# Patient Record
Sex: Female | Born: 1965 | Race: Black or African American | Hispanic: No | Marital: Single | Smoking: Current every day smoker
Health system: Southern US, Community
[De-identification: ages and names within clinical notes are randomized; demographics above are authoritative.]

## PROBLEM LIST (undated history)

## (undated) DIAGNOSIS — M109 Gout, unspecified: Secondary | ICD-10-CM

## (undated) DIAGNOSIS — E7800 Pure hypercholesterolemia, unspecified: Secondary | ICD-10-CM

## (undated) DIAGNOSIS — F329 Major depressive disorder, single episode, unspecified: Secondary | ICD-10-CM

## (undated) DIAGNOSIS — F32A Depression, unspecified: Secondary | ICD-10-CM

## (undated) DIAGNOSIS — I10 Essential (primary) hypertension: Secondary | ICD-10-CM

## (undated) DIAGNOSIS — E119 Type 2 diabetes mellitus without complications: Secondary | ICD-10-CM

## (undated) HISTORY — DX: Type 2 diabetes mellitus without complications: E11.9

## (undated) HISTORY — DX: Gout, unspecified: M10.9

## (undated) HISTORY — DX: Major depressive disorder, single episode, unspecified: F32.9

## (undated) HISTORY — PX: CHOLECYSTECTOMY: SHX55

## (undated) HISTORY — PX: ABDOMINAL HYSTERECTOMY: SHX81

## (undated) HISTORY — DX: Depression, unspecified: F32.A

## (undated) HISTORY — PX: TONSILLECTOMY: SUR1361

---

## 2000-04-23 ENCOUNTER — Encounter: Admission: RE | Admit: 2000-04-23 | Discharge: 2000-04-23 | Payer: Self-pay | Admitting: Family Medicine

## 2000-04-23 ENCOUNTER — Encounter: Payer: Self-pay | Admitting: Family Medicine

## 2000-11-22 ENCOUNTER — Encounter: Payer: Self-pay | Admitting: Family Medicine

## 2000-11-22 ENCOUNTER — Ambulatory Visit (HOSPITAL_COMMUNITY): Admission: RE | Admit: 2000-11-22 | Discharge: 2000-11-22 | Payer: Self-pay | Admitting: Family Medicine

## 2003-01-05 ENCOUNTER — Inpatient Hospital Stay (HOSPITAL_COMMUNITY): Admission: EM | Admit: 2003-01-05 | Discharge: 2003-01-12 | Payer: Self-pay | Admitting: Psychiatry

## 2003-03-09 ENCOUNTER — Emergency Department (HOSPITAL_COMMUNITY): Admission: EM | Admit: 2003-03-09 | Discharge: 2003-03-09 | Payer: Self-pay | Admitting: Emergency Medicine

## 2004-01-13 ENCOUNTER — Inpatient Hospital Stay (HOSPITAL_COMMUNITY): Admission: RE | Admit: 2004-01-13 | Discharge: 2004-01-21 | Payer: Self-pay | Admitting: Psychiatry

## 2004-01-13 ENCOUNTER — Ambulatory Visit: Payer: Self-pay | Admitting: Psychiatry

## 2004-01-18 ENCOUNTER — Encounter (HOSPITAL_COMMUNITY): Payer: Self-pay | Admitting: Psychiatry

## 2004-01-20 ENCOUNTER — Encounter (HOSPITAL_COMMUNITY): Admission: RE | Admit: 2004-01-20 | Discharge: 2004-04-19 | Payer: Self-pay | Admitting: Internal Medicine

## 2005-01-15 ENCOUNTER — Ambulatory Visit: Payer: Self-pay | Admitting: Cardiology

## 2006-12-06 ENCOUNTER — Ambulatory Visit: Payer: Self-pay | Admitting: Psychiatry

## 2006-12-06 ENCOUNTER — Inpatient Hospital Stay (HOSPITAL_COMMUNITY): Admission: AD | Admit: 2006-12-06 | Discharge: 2006-12-11 | Payer: Self-pay | Admitting: *Deleted

## 2007-01-09 ENCOUNTER — Ambulatory Visit: Payer: Self-pay | Admitting: Psychiatry

## 2007-01-09 ENCOUNTER — Inpatient Hospital Stay (HOSPITAL_COMMUNITY): Admission: EM | Admit: 2007-01-09 | Discharge: 2007-01-14 | Payer: Self-pay | Admitting: Psychiatry

## 2007-04-29 ENCOUNTER — Encounter: Admission: RE | Admit: 2007-04-29 | Discharge: 2007-04-29 | Payer: Self-pay | Admitting: Family Medicine

## 2008-04-30 ENCOUNTER — Ambulatory Visit (HOSPITAL_COMMUNITY): Admission: RE | Admit: 2008-04-30 | Discharge: 2008-04-30 | Payer: Self-pay | Admitting: Family Medicine

## 2009-06-16 ENCOUNTER — Encounter (HOSPITAL_COMMUNITY): Admission: RE | Admit: 2009-06-16 | Discharge: 2009-07-16 | Payer: Self-pay | Admitting: Orthopaedic Surgery

## 2010-05-16 NOTE — Discharge Summary (Signed)
NAME:  Betty Lutz, Betty Lutz NO.:  [id number]   MEDICAL RECORD NO.:  [id number]          PATIENT TYPE:  IPS   LOCATION:  0602                          FACILITY:  [facility]   PHYSICIAN:  Jasmine Pang, M.D. DATE OF BIRTH:  12/11/1965   DATE OF ADMISSION:  12/06/2006  DATE OF DISCHARGE:  12/11/2006                               DISCHARGE SUMMARY   IDENTIFICATION:  This is a 45 year old single African American female  who was admitted on an involuntary basis on December 06, 2006.   HISTORY OF PRESENT ILLNESS:  The commitment papers indicate that the  patient is depressed.  She was exhibiting suicidal ideation.  She was  abusing substances by her report.  The patient call 911.  She stated she  had been drinking and had taken 0.5 mg Xanax.  She told 911 she was  suicidal because she and her boyfriend were having problems.  Her  alcohol level in the emergency room at [facility] was 292.  Her urine drug  screen was positive for cocaine only.  Other labs were within normal  limits done in the emergency room.  She was with [place] in the [facility] from January 13, 2004, to January 21, 2004.  She had a  similar presentation and course at that time.  She has followed  outpatient at [facility].  Her doctor is Dr.  Duayne Cal.   She has GERD, hypertension, and gout.  She is status post a  cholecystectomy.   She states she is prescribed Xanax, dose unknown.  Indocin 50 mg b.i.d.  p.r.n., Reglan 20 mg q.i.d., Remeron 30 mg at bedtime, Diovan 160 mg  p.o. daily, and Cymbalta 30 mg p.o. daily.   She denies any allergies.   PHYSICAL FINDINGS:  The patient was medically cleared in the emergency  department at [facility].  She was intoxicated.  She had no other  remarkable physical or medical findings.  Her labs done in the ED were  within normal limits.   HOSPITAL COURSE:  Upon admission, the patient was placed on the Librium  detox protocol.  She was also  continued on Wellbutrin 150 mg daily,  allopurinol 100 mg q.a.m., Diovan 80 mg q.a.m., Cymbalta 30 mg daily,  and indomethacin 50 mg p.o. b.i.d.  She was placed on Sudafed 30 mg p.o.  b.i.d.  On December 08, 2006, Cymbalta was discontinued; she was placed  on Remeron 30 mg p.o. q.h.s.  The patient tolerated these medications  well with no significant side effects.  She was friendly and cooperative  in individual sessions with me.  She was able to participate  appropriately in unit therapeutic groups and activities.  She discussed  her stressors prior to admission.  She states she is on disability for  depression and gout.  She wanted her boyfriend to come in for a family  session, and this was set up by our case Production designer, theatre/television/film.  On the day of  discharge, her mood improved.  Her sleep was good on the Remeron.  Appetite was good.  She was less  depressed and less anxious.  There was  no suicidal ideation.  She was looking forward to her family session  with her sister and boyfriend; they were very supportive.  She discussed  numerous stressors.  She wants to work on these issues in outpatient  therapy.  Boyfriend and sister were very supportive.  They discussed  their relationship and issues of trust.  She stated she felt good going  back to her boyfriend and plans on going to his place to live, at least  temporarily.  She could not think of any stressors at that time that she  needed to worry about.  Her mood was euthymic.  Affect, wide range.  There was no suicidal or homicidal ideation.  No thoughts of self-  injurious behavior.  No auditory or visual hallucinations.  No paranoia  or delusions.  Thoughts were logical and goal-directed.  Thought  content, no predominant theme.  It was felt the patient was stable to be  discharged.   DISCHARGE DIAGNOSES:  AXIS I:  Alcohol dependence.  Polysubstance abuse.  Depressive disorder, not otherwise specified.  AXIS II:  None.  AXIS III:  The patient has  gastroesophageal reflux disease and  hypertension.  AXIS IV:  Moderate (relationship issues, burden of chemical dependence  issues, and burden of psychiatric illness).  AXIS V:  Global assessment of functioning upon discharge was 50.  Global  assessment of functioning upon admission was 45.  Global assessment of  functioning highest past year was 60-65.   DISCHARGE PLANS:  There were no specific activity level or dietary  restrictions.   POST-HOSPITAL CARE PLANS:  The patient will go to [facility] on December 19, 2006, at 10 a.m.   DISCHARGE MEDICATIONS:  1. Wellbutrin XL 150 mg one tablet daily.  2. Multivitamin one tablet daily.  3. Allopurinol 100 mg daily.  4. Avapro/Diovan one tablet daily.  5. Indocin 50 mg one tablet twice a day with food.  6. Tylenol 325 mg two caps every 4 hours as needed for pain.      Jasmine Pang, M.D.  Electronically Signed     BHS/MEDQ  D:  01/08/2007  T:  01/09/2007  Job:  [id number]

## 2010-05-16 NOTE — H&P (Signed)
NAME:  Betty Lutz, BLASS NO.:  [id number]   MEDICAL RECORD NO.:  [id number]          PATIENT TYPE:  IPS   LOCATION:  0502                          FACILITY:  [facility]   PHYSICIAN:  Geoffery Lyons, M.D.      DATE OF BIRTH:  [birth year]   DATE OF ADMISSION:  01/09/2007  DATE OF DISCHARGE:                       PSYCHIATRIC ADMISSION ASSESSMENT   A 45 year old female voluntarily admitted on January 09, 2007.   HISTORY OF PRESENT ILLNESS:  The patient present with a history of  alcohol use, has been drinking 2-3 40 ounces beers daily.  Relapsed  approximately  2 days ago, was sober for about 3 weeks.  The patient  states that she has been drinking since the age of 4.  She states that  she has tried to stop prior.  She was recently discharged from out  facility in December for similar symptoms. She states that the reason  she relapsed was that she found out her boyfriend was cheating on her.  She intends to go back with this boyfriend.  She does report some  suicidal thoughts but denies any specific plan.  Also has been using  cocaine.   PAST PSYCHIATRIC HISTORY:  Second admission to [facility],  was here in December 2008 for alcohol use and depressive symptoms.  Was  scheduled to go to AA meetings but states she had problems with  transportation.  She is a client of [facility], seeing Dr. Rudi Heap.  Reports  a history of overdose in the past and alcohol.   SOCIAL HISTORY:  She is a 45 year old female, has a 71 year old son and  lives in [place].  Son is a working and she has no concerns about  him.  She is unemployed and is on SSI for her depression and gout.  She  does have a supportive sister.   FAMILY HISTORY:  None.   ALCOHOL DRUG HISTORY:  As above.  Denies any seizures or blackouts.   MEDICAL HISTORY:  Primary care [place] Fails:  None listed.  Medical problems  are gout, hypertension.   MEDICATIONS:  Has been on Wellbutrin 300 mg daily, Remeron 30 at  bedtime, Cymbalta 30 daily, allopurinol 100 mg daily, Diovan 160 mg p.o.  daily.   DRUG ALLERGIES:  No known allergies.   PHYSICAL EXAM:  This is a female that was assessed at [facility]  overweight, unkempt, but in no distress.  No tremors.  No withdrawal  symptoms noted. Her temperature is 97.5, 92 heart rate, 22 respirations,  blood pressure is 134/85, 5 feet 6 inches tall, 248 pounds. Her urine  drug screen is positive for cocaine.  Urinalysis is negative.  Hemoglobin of 11.5, hematocrit 33.6, RDW is 15.9.   MENTAL STATUS EXAM:  She is fully alert, cooperative, good eye contact,  somewhat unkempt.  Her speech is clear, normal pace and tone.  The  patient's mood is depressed.  The patient's affect is flat, although she  does seem to have a sense of humor.  Her thought process are coherent  and goal directed.  Denies any suicidal thoughts or homicidal ideation,  cognitive function intact.  Her memory is good.  Her judgment and  insight is fair.  Poor impulse control.  She appears sincere.   AXIS I:  Depressive disorder NOS, polysubstance abuse.  AXIS II:  Deferred.  AXIS III:  Hypertension, gout AXIS IV:  Problems with primary support  group, other psychosocial problems.  Medical problems.  AXIS V:  Current is 40.   PLAN:  Contract for safety.  Stabilize mood and thinking.  We will DC  her Xanax, will have Librium available for withdrawal symptoms.  We will  have a case manager look at any rehab programs.  The patient is  considering inpatient rehab program. We will work on relapse prevention  and medication compliance.  Her tentative length of stay is 4-5 days.      Landry Corporal, N.P.      Geoffery Lyons, M.D.  Electronically Signed    JO/MEDQ  D:  01/10/2007  T:  01/10/2007  Job:  [id number]

## 2010-05-16 NOTE — H&P (Signed)
NAME:  Betty Lutz, Betty Lutz NO.:  [id number]   MEDICAL RECORD NO.:  [id number]          PATIENT TYPE:  IPS   LOCATION:  0602                          FACILITY:  [facility]   PHYSICIAN:  Jasmine Pang, M.D. DATE OF BIRTH:  30-Jan-1965   DATE OF ADMISSION:  12/06/2006  DATE OF DISCHARGE:                       PSYCHIATRIC ADMISSION ASSESSMENT   This is an involuntary admission to the services of Dr. Milford Cage.   IDENTIFYING INFORMATION:  This is a 45 year old single African American  female.  The commitment papers indicate that she is depressed, she was  exhibiting suicidal ideation, she has been abusing substances.  The  patient called 911.  She stated she had been drinking and had taken five  0.5 mg Xanax.  She told 911 she was suicidal because she and her  boyfriend were having problems.  Her alcohol level, in the emergency  room at [facility], was 292.  Her urine drug screen was positive for  cocaine only, and her other labs were within normal limits.   PAST PSYCHIATRIC HISTORY:  She was here with [place] in the behavioral health  center January 12 to January 21, 2004.  She had a similar presentation  and course at that time.  She is followed outpatient at [facility].   SOCIAL HISTORY:  She went to the 11th grade.  She was never married.  She does have one 8 year old son.  She gets SSI for gout and  depression.   FAMILY HISTORY:  She denies.   ALCOHOL AND DRUG HISTORY:  She varies on her alcohol abuse.  It depends  on how depressed she is, but she can drink up to a fifth of liquor a  day.   PRIMARY CARE PHYSICIAN:  Unknown.  She is followed at [facility] by Dr. Duayne Cal.   MEDICAL PROBLEMS:  GERD, hypertension, gout.  She is status post a  cholecystectomy.   MEDICATIONS:  She states that she is prescribed Xanax 0.5 mg __________  is unknown, Indocin 50 mg p.o. b.i.d. p.r.n., Reglan 20 mg q.i.d.,  Remeron 30 mg at h.s.,  Diovan 160 mg p.o. every day, Cymbalta 30 mg p.o.  every day.   DRUG ALLERGIES:  NO KNOWN DRUG ALLERGIES.   POSITIVE PHYSICAL FINDINGS:  She was medically cleared in the emergency  department at [facility].  She was intoxicated.  She had no other  remarkable physical findings today.  VITAL SIGNS:  Her vital signs show she is 66.5, weight is 239,  temperature 97.1, blood pressure is 170/91 and standing it is 147/93,  pulse is 94, respirations are 20.  She has a slight hand tremor and she  also has a cold.  MENTAL STATUS EXAM:  She is alert and oriented x3.  She is casually, but  appropriately groomed and dressed, adequately nourished.  Her speech is  a normal rate, rhythm, and tone.  Her mood is appropriate to the  situation.  Her affect is somewhat depressed.  Thought processes are  clear, rational, goal oriented.  Judgment and insight are fair.  Concentration and memory are  intact.  Intelligence is average to low  average.  She denies being suicidal or homicidal.  She denies  auditory/visual hallucinations.  She does have a slight hand tremor from  her withdrawal.   DIAGNOSES:  AXIS I:  Alcohol abuse, rule out dependence.  Here for  detox.  Depressive disorder not otherwise specified.  AXIS II:  Deferred.  AXIS III:  Developing a cold, runny nose, GERD, hypertension.  AXIS IV:  Relationship issues.  AXIS V:  45.   PLAN:  To admit for alcohol detox.  She denies the need for medication  adjustment.  She just states she had possibly overdosed after fight with  boyfriend and drinking.  She has her own place and feels safe to return.  Her estimated length of stay is 2 to 3 days.      Mickie Leonarda Salon, P.A.-C.      Jasmine Pang, M.D.  Electronically Signed    MD/MEDQ  D:  12/07/2006  T:  12/09/2006  Job:  [id number]

## 2010-05-19 NOTE — Discharge Summary (Signed)
NAME:  Betty Lutz, ABDELRAHMAN NO.:  [id number]   MEDICAL RECORD NO.:  [id number]          PATIENT TYPE:  IPS   LOCATION:  0502                          FACILITY:  [facility]   PHYSICIAN:  Geoffery Lyons, M.D.      DATE OF BIRTH:  Dec 15, 1965   DATE OF ADMISSION:  01/09/2007  DATE OF DISCHARGE:  01/14/2007                               DISCHARGE SUMMARY   CHIEF COMPLAINT AND PRESENT ILLNESS:  This was the second admission to  [facility] for this 45 year old female voluntarily  admitted.  History of alcohol use.  Has been drinking 2-3 40 ounces of  beer daily.  Relapsed 2 days prior to this admission.  Was sober for  about 3 weeks.  She had been drinking since the age of 62.  She had  tried to stop, recently discharged from our facility in December for the  same reason.  Claims she relapsed after she found out that her boyfriend  was cheating on her.  She intends to go back with this boyfriend.  Has  been also using cocaine.   PAST PSYCHIATRIC HISTORY:  Second time at Behavior Health.  Admitted  December 2008 for alcohol abuse and depressive symptoms. Client of  [facility],  been seen by Dr. Rudi Heap.   ALCOHOL AND DRUG HISTORY:  As already stated, she just relapsed on  alcohol, also using cocaine.   MEDICAL HISTORY:  Gout and hypertension.   MEDICATION:  1. Wellbutrin 300 mg per day.  2. Remeron 30 mg at bedtime.  3. Cymbalta 30 mg per day.  4. Allopurinol 100 mg per day.  5. Diovan 160 mg per day.   Physical exam was performed and failed to show any acute findings.   LABORATORY WORKUP:  White blood cells 7.3, hemoglobin 11.5, mean  corpuscular volume 78.1.  Drug screening positive for cocaine.  Glucose  126, BUN 6,  6, creatinine 0.9, potassium 3.8 sodium 137.   MENTAL STATUS EXAM:  An alert, cooperative female, good eye contact,  somewhat unkempt.  Speech was clear, normal in rate, tempo and  production.  Mood was depressed.  Affect was depressed.  Thought  processes are logical, coherent and relevant.  Endorsed feeling  overwhelmed, upset with the situation with the boyfriend, upset with  herself for having relapsed, but denied any active suicidal or homicidal  ideas, no evidence of delusions.  Cognition:  No hallucinations.  Cognition well-preserved.   ADMISSION DIAGNOSES:  AXIS I:  1.  Depressive disorder, not otherwise  specified.  2.  Alcohol abuse, rule out dependence.  3.  Cocaine abuse,  rule out dependence.  AXIS II:  No diagnosis.  AXIS III:  1.  Hypertension.  2.  Gout.  AXIS IV:  Moderate.  AXIS V:  On admission 35,  highest GAF in the last year 60.   COURSE IN THE HOSPITAL:  She was admitted, started in individual and  group psychotherapy.  We did a Librium detox.  She was placed on  allopurinol and was given Indocin.  As already stated, a 45 year old  female recently discharged  from the unit, initially admitted due to  depression as well as substance abuse status post suicide gesture,  overdosing on Xanax.  She was detoxed, then discharged stable.  Stayed  sober for a couple of weeks and relapsed after she found out that the  boyfriend was cheating on her.  The boyfriend also drinks.  Although she  knows this is not a healthy relationship, she still wants to maintain  it.  Has been drinking since 20 on and off.  Lives by herself, on  disability due to depression and gout.  Has a 91 year old son who lives  in [place].  By January 12 endorsed that she was starting to feel  better.  Boyfriend came to see her and he was willing to work on the  relationship.  Endorsed that she would not allow issues with him to send  back using.  Endorsed that she was pretty committed to make this work  for herself, was wanting to go into a residential treatment program but  on January 13 endorsed that she was better, committed to abstinence, and  was wanting to go home and follow up on an outpatient basis.  Endorsed  that she was clear  that if this was not to work with her boyfriend, she  was going to leave him and no matter what happened, she was not going to  relapse.   DISCHARGE DIAGNOSES:  AXIS I:  1.  Alcohol dependence.  2.  Cocaine  abuse.  3.  Major depressive disorder.  AXIS II:  No diagnosis.  AXIS III:  1.  Hypertension.  2.  Gout.  AXIS IV:  Moderate.  AXIS V:  On discharge 50-55.   Discharged on:  1. Allopurinol 100 mg per day.  2. Indocin 25 mg twice a day with food.  3. Remeron 30 mg per day.  4. Diovan 160 mg in the morning.   Follow up at [facility], Dr. Mammie Russian.      Geoffery Lyons, M.D.  Electronically Signed     IL/MEDQ  D:  02/02/2007  T:  02/03/2007  Job:  [id number]

## 2010-05-19 NOTE — Discharge Summary (Signed)
NAME:  Betty Lutz, Betty Lutz                         ACCOUNT NO.:  [id number]   MEDICAL RECORD NO.:  [id number]                   PATIENT TYPE:  IPS   LOCATION:  0303                                 FACILITY:  BH   PHYSICIAN:  Jeanice Lim, M.D.              DATE OF BIRTH:  Oct 09, 1965   DATE OF ADMISSION:  01/05/2003  DATE OF DISCHARGE:  01/12/2003                                 DISCHARGE SUMMARY   IDENTIFYING DATA:  This is a 45 year old African-American female, single,  voluntarily admitted, requesting help getting off alcohol, requiring detox.  Reporting thoughts of hopelessness, wanting to drink herself to death,  unable to stop drinking.   MEDICATIONS:  Wellbutrin, Diovan, Remeron.   ALLERGIES:  No known drug allergies.   PHYSICAL EXAMINATION:  Within normal limits.  Neurologically nonfocal.   LABORATORY DATA:  Routine admission labs within normal limits.   MENTAL STATUS EXAM:  Fully alert, blunted affect, limping left foot  secondary to gout.  Speech within normal limits.  Mood depressed and mildly  anxious.  Some tremor.  Thought processes goal directed.  Positive suicidal  ideation without plan.  Cognitively intact.  Judgment and insight fair.   ADMISSION DIAGNOSES:   AXIS I:  1. Major depressive disorder, recurrent, severe.  2. Alcohol dependence.  3. Cocaine abuse history.   AXIS II:  Deferred.   AXIS III:  1. Microcytic anemia.  2. Acute gouty arthritis.   AXIS IV:  Severe (stressors related to social environment).   AXIS V:  30/60.   HOSPITAL COURSE:  The patient was admitted and ordered routine p.r.n.  medications and underwent further monitoring.  Was encouraged to participate  in individual, group and milieu therapy.  The patient described having some  mood swings as well as experienced withdrawal symptoms.  Seen by internal  medicine regarding multiple medical problems and all medical recommendations  are followed as indicated.  The patient was  adjusted on medical medications  as well as psychotropics and safely detoxed without complications.  The  patient reported improved sleep, mood stability and motivation to get into a  substance abuse treatment program.  Motivated to be abstinent, showing  increased insight and healthier coping skills.   DISCHARGE MEDICATIONS:  1. Prednisone 20 mg b.i.d.  2. Risperdal 1 mg, 1/2 q.a.m. and 1 q.h.s.  3. Trazodone 100 mg q.h.s.  4. Ambien 10 mg, 1/2-1 q.h.s.  5. Iron 325 mg b.i.d.  6. Protonix 40 mg q.a.m.  7. Wellbutrin XL 150 mg q.a.m.  8. Colchicine 0.6 mg q.a.m.  9. Remeron 30 mg q.h.s.   FOLLOW UP:  The patient was to follow up at [facility] and [facility].   DISCHARGE DIAGNOSES:   AXIS I:  1. Major depressive disorder, recurrent, severe.  2. Alcohol dependence.  3. Cocaine abuse history.   AXIS II:  Deferred.   AXIS III:  1. Microcytic  anemia.  2. Acute gouty arthritis.   AXIS IV:  Severe (stressors related to social environment).   AXIS V:  Global Assessment of Functioning on discharge 55.                                               Jeanice Lim, M.D.    JEM/MEDQ  D:  02/07/2003  T:  02/07/2003  Job:  [id number]
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
## 2010-10-26 ENCOUNTER — Encounter: Payer: Self-pay | Admitting: *Deleted

## 2010-10-26 ENCOUNTER — Emergency Department (HOSPITAL_COMMUNITY)
Admission: EM | Admit: 2010-10-26 | Discharge: 2010-10-26 | Disposition: A | Discharge status: Home / Self Care | Payer: Medicaid Other | Attending: Emergency Medicine | Admitting: Emergency Medicine

## 2010-10-26 DIAGNOSIS — M79609 Pain in unspecified limb: Secondary | ICD-10-CM | POA: Insufficient documentation

## 2010-10-26 DIAGNOSIS — T148XXA Other injury of unspecified body region, initial encounter: Secondary | ICD-10-CM

## 2010-10-26 DIAGNOSIS — X58XXXA Exposure to other specified factors, initial encounter: Secondary | ICD-10-CM | POA: Insufficient documentation

## 2010-10-26 DIAGNOSIS — M542 Cervicalgia: Secondary | ICD-10-CM | POA: Insufficient documentation

## 2010-10-26 DIAGNOSIS — I10 Essential (primary) hypertension: Secondary | ICD-10-CM | POA: Insufficient documentation

## 2010-10-26 DIAGNOSIS — M25519 Pain in unspecified shoulder: Secondary | ICD-10-CM | POA: Insufficient documentation

## 2010-10-26 DIAGNOSIS — F17200 Nicotine dependence, unspecified, uncomplicated: Secondary | ICD-10-CM | POA: Insufficient documentation

## 2010-10-26 DIAGNOSIS — Z79899 Other long term (current) drug therapy: Secondary | ICD-10-CM | POA: Insufficient documentation

## 2010-10-26 HISTORY — DX: Pure hypercholesterolemia, unspecified: E78.00

## 2010-10-26 HISTORY — DX: Essential (primary) hypertension: I10

## 2010-10-26 MED ORDER — NAPROXEN 500 MG PO TABS: 500.0000 mg | ORAL_TABLET | Freq: Two times a day (BID) | ORAL | Status: AC

## 2010-10-26 MED ORDER — KETOROLAC TROMETHAMINE 60 MG/2ML IM SOLN
60.0000 mg | Freq: Once | INTRAMUSCULAR | Status: AC
  Administered 2010-10-26: 60 mg via INTRAMUSCULAR
  Filled 2010-10-26: qty 2
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
## 2012-04-23 ENCOUNTER — Other Ambulatory Visit: Payer: Self-pay

## 2012-04-30 ENCOUNTER — Other Ambulatory Visit (INDEPENDENT_AMBULATORY_CARE_PROVIDER_SITE_OTHER): Payer: Medicaid Other

## 2012-04-30 DIAGNOSIS — Z79899 Other long term (current) drug therapy: Secondary | ICD-10-CM

## 2012-04-30 LAB — LIPID PANEL
LDL Cholesterol: 98 mg/dL (ref 0–99)
Triglycerides: 201 mg/dL — ABNORMAL HIGH (ref <150)
VLDL: 40 mg/dL (ref 0–40)

## 2012-04-30 LAB — HEPATIC FUNCTION PANEL
ALT: 21 U/L (ref 0–35)
Bilirubin, Direct: 0.1 mg/dL (ref 0.0–0.3)
Indirect Bilirubin: 0.4 mg/dL (ref 0.0–0.9)

## 2012-04-30 LAB — HEMOGLOBIN A1C: Hgb A1c MFr Bld: 5.7 % — ABNORMAL HIGH (ref <5.7)

## 2012-04-30 NOTE — Progress Notes (Signed)
Patient has been seeing donna steadman but is going to make an appointment to establish someone new. These labs were ordered by [facility] and they need to be faxed to [phone].

## 2012-05-07 ENCOUNTER — Other Ambulatory Visit: Payer: Self-pay | Admitting: *Deleted

## 2012-05-28 ENCOUNTER — Ambulatory Visit (INDEPENDENT_AMBULATORY_CARE_PROVIDER_SITE_OTHER): Payer: Medicaid Other | Admitting: Pharmacist

## 2012-05-28 VITALS — BP 138/90 | HR 78 | Ht 67.5 in | Wt 254.0 lb

## 2012-05-28 DIAGNOSIS — E669 Obesity, unspecified: Secondary | ICD-10-CM | POA: Insufficient documentation

## 2012-05-28 DIAGNOSIS — F32A Depression, unspecified: Secondary | ICD-10-CM

## 2012-05-28 DIAGNOSIS — F329 Major depressive disorder, single episode, unspecified: Secondary | ICD-10-CM | POA: Insufficient documentation

## 2012-05-28 DIAGNOSIS — I10 Essential (primary) hypertension: Secondary | ICD-10-CM

## 2012-05-28 DIAGNOSIS — E785 Hyperlipidemia, unspecified: Secondary | ICD-10-CM

## 2012-05-28 DIAGNOSIS — E119 Type 2 diabetes mellitus without complications: Secondary | ICD-10-CM

## 2012-05-28 DIAGNOSIS — F339 Major depressive disorder, recurrent, unspecified: Secondary | ICD-10-CM | POA: Insufficient documentation

## 2012-05-28 DIAGNOSIS — M109 Gout, unspecified: Secondary | ICD-10-CM | POA: Insufficient documentation

## 2012-05-28 MED ORDER — ATORVASTATIN CALCIUM 40 MG PO TABS: 40.0000 mg | ORAL_TABLET | Freq: Every day | ORAL | Status: DC

## 2012-05-28 MED ORDER — METFORMIN HCL ER 500 MG PO TB24: 500.0000 mg | ORAL_TABLET | Freq: Every day | ORAL | Status: DC

## 2012-05-28 NOTE — Progress Notes (Signed)
Diabetes Follow-Up Visit Chief Complaint:  No chief complaint on file.    Filed Vitals:   05/28/12 1024  BP: 138/90  Pulse: 78   Filed Weights   05/28/12 1024  Weight: 254 lb (115.214 kg)     HPI: patient diagnosis with type 2 diabetes several years ago.  Previously took metformin XR 500mg  daily with good results but patient stopped a few months ago because she thought 500mg  was a large dose.   Also she has not taken atorvastatin 80mg  for 1 year.     Current Diabetes Medications:  none  Exam Edema:  negative  Polyuria:  negative  Polydipsia:  negative Polyphagia:  negative  BMI:  Body mass index is 39.17 kg/(m^2).   Weight changes:  stable General Appearance:  obese Mood/Affect:  normal   Low fat/carbohydrate diet?  No Nicotine Abuse?  Yes Medication Compliance?  No Exercise?  No Alcohol Abuse?  No  Home BG Monitoring:  Checking 1 times a day. High: 179  Low:  110    Lab Results  Component Value Date   HGBA1C 5.7* 04/30/2012    No results found for this basenameConcepcion Lutz    Lab Results  Component Value Date   CHOL 187 04/30/2012   HDL 49 04/30/2012   LDLCALC 98 04/30/2012   TRIG 201* 04/30/2012   CHOLHDL 3.8 04/30/2012      Assessment: 1.  Diabetes.  Controlled but should restart metformin 2.  Blood Pressure.  DBP slightly elevated today 3.  Lipids.  Elevated Tg - should be on a statin   Recommendations: 1.  Medication recommendations at this time are as follows:  Restart metformin 500mg  XR 1 tablet daily  Restart atorvastatin at 40mg  1 tablet daily 2.  Reviewed HBG goals:  Fasting 80-130 and 1-2 hour post prandial <180.  Patient is instructed to check BG 1 times per day.    3.  BP goal < 140/80. 4.  LDL goal of < 100, HDL > 40 and TG < 150. 5.  Eye Exam yearly and Dental Exam every 6 months. 6.  Dietary recommendations:  Reviewed CHO amounts in various foods and serving recommendations.  Patient instructed to increase fresh fruits and  vegetables, limit high CHO foods, sweets and fatty meats.  7.  Physical Activity recommendations:  Recommended start exercise - 10 minutes and work up to 30 minutes daily as able 8.  Return to clinic in 4-6 wks   Time spent counseling patient:  40 minutes  Referring provider:  Modesto Charon

## 2012-05-28 NOTE — Patient Instructions (Addendum)
1800 Calorie Diet for Diabetes Meal Planning The 1800 calorie diet is designed for eating up to 1800 calories each day. Following this diet and making healthy meal choices can help improve overall health. This diet controls blood sugar (glucose) levels and can also help lower blood pressure and cholesterol. SERVING SIZES Measuring foods and serving sizes helps to make sure you are getting the right amount of food. The list below tells how big or small some common serving sizes are:  1 oz.........4 stacked dice.  3 oz.........Deck of cards.  1 tsp........Tip of little finger.  1 tbs........Thumb.  2 tbs........Golf ball.   cup.......Half of a fist.  1 cup........A fist. GUIDELINES FOR CHOOSING FOODS The goal of this diet is to eat a variety of foods and limit calories to 1800 each day. This can be done by choosing foods that are low in calories and fat. The diet also suggests eating small amounts of food frequently. Doing this helps control your blood glucose levels so they do not get too high or too low. Each meal or snack may include a protein food source to help you feel more satisfied and to stabilize your blood glucose. Try to eat about the same amount of food around the same time each day. This includes weekend days, travel days, and days off work. Space your meals about 4 to 5 hours apart and add a snack between them if you wish.  For example, a daily food plan could include breakfast, a morning snack, lunch, dinner, and an evening snack. Healthy meals and snacks include whole grains, vegetables, fruits, lean meats, poultry, fish, and dairy products. As you plan your meals, select a variety of foods. Choose from the bread and starch, vegetable, fruit, dairy, and meat/protein groups. Examples of foods from each group and their suggested serving sizes are listed below. Use measuring cups and spoons to become familiar with what a healthy portion looks like. Bread and Starch Each serving  equals 15 grams of carbohydrates.  1 slice bread.   bagel.   cup cold cereal (unsweetened).   cup hot cereal or mashed potatoes.  1 small potato (size of a computer mouse).   cup cooked pasta or rice.   English muffin.  1 cup broth-based soup.  3 cups of popcorn.  4 to 6 whole-wheat crackers.   cup cooked beans, peas, or corn. Vegetable Each serving equals 5 grams of carbohydrates.   cup cooked vegetables.  1 cup raw vegetables.   cup tomato or vegetable juice. Fruit Each serving equals 15 grams of carbohydrates.  1 small apple or orange.  1 cup watermelon or strawberries.   cup applesauce (no sugar added).  2 tbs raisins.   banana.   cup canned fruit, packed in water, its own juice, or sweetened with a sugar substitute.   cup unsweetened fruit juice. Dairy Each serving equals 12 to 15 grams of carbohydrates.  1 cup fat-free milk.  6 oz artificially sweetened yogurt or plain yogurt.  1 cup low-fat buttermilk.  1 cup soy milk.  1 cup almond milk. Meat/Protein  1 large egg.  2 to 3 oz meat, poultry, or fish.   cup low-fat cottage cheese.  1 tbs peanut butter.  1 oz low-fat cheese.   cup tuna in water.   cup tofu. Fat  1 tsp oil.  1 tsp trans-fat-free margarine.  1 tsp butter.  1 tsp mayonnaise.  2 tbs avocado.  1 tbs salad dressing.  1 tbs cream cheese.    2 tbs sour cream. SAMPLE 1800 CALORIE DIET PLAN Breakfast   cup unsweetened cereal (1 carb serving).  1 cup fat-free milk (1 carb serving).  1 slice whole-wheat toast (1 carb serving).   small banana (1 carb serving).  1 scrambled egg.  1 tsp trans-fat-free margarine. Lunch  Tuna sandwich.  2 slices whole-wheat bread (2 carb servings).   cup canned tuna in water, drained.  1 tbs reduced fat mayonnaise.  1 stalk celery, chopped.  2 slices tomato.  1 lettuce leaf.  1 cup carrot sticks.  24 to 30 seedless grapes (2 carb  servings).  6 oz light yogurt (1 carb serving). Afternoon Snack  3 graham cracker squares (1 carb serving).  Fat-free milk, 1 cup (1 carb serving).  1 tbs peanut butter. Dinner  3 oz salmon, broiled with 1 tsp oil.  1 cup mashed potatoes (2 carb servings) with 1 tsp trans-fat-free margarine.  1 cup fresh or frozen green beans.  1 cup steamed asparagus.  1 cup fat-free milk (1 carb serving). Evening Snack  3 cups air-popped popcorn (1 carb serving).  2 tbs parmesan cheese sprinkled on top. MEAL PLAN Use this worksheet to help you make a daily meal plan based on the 1800 calorie diet suggestions. If you are using this plan to help you control your blood glucose, you may interchange carbohydrate-containing foods (dairy, starches, and fruits). Select a variety of fresh foods of varying colors and flavors. The total amount of carbohydrate in your meals or snacks is more important than making sure you include all of the food groups every time you eat. Choose from the following foods to build your day's meals:  8 Starches.  4 Vegetables.  3 Fruits.  2 Dairy.  6 to 7 oz Meat/Protein.  Up to 4 Fats. Your dietician can use this worksheet to help you decide how many servings and which types of foods are right for you. BREAKFAST Food Group and Servings / Food Choice Starch ________________________________________________________ Dairy _________________________________________________________ Fruit _________________________________________________________ Meat/Protein __________________________________________________ Fat ___________________________________________________________ LUNCH Food Group and Servings / Food Choice Starch ________________________________________________________ Meat/Protein __________________________________________________ Vegetable _____________________________________________________ Fruit  _________________________________________________________ Dairy _________________________________________________________ Fat ___________________________________________________________ AFTERNOON SNACK Food Group and Servings / Food Choice Starch ________________________________________________________ Meat/Protein __________________________________________________ Fruit __________________________________________________________ Dairy _________________________________________________________ DINNER Food Group and Servings / Food Choice Starch _________________________________________________________ Meat/Protein ___________________________________________________ Dairy __________________________________________________________ Vegetable ______________________________________________________ Fruit ___________________________________________________________ Fat ____________________________________________________________ EVENING SNACK Food Group and Servings / Food Choice Fruit __________________________________________________________ Meat/Protein ___________________________________________________ Dairy __________________________________________________________ Starch _________________________________________________________ DAILY TOTALS Starch ____________________________ Vegetable _________________________ Fruit _____________________________ Dairy _____________________________ Meat/Protein______________________ Fat _______________________________ Document Released: 07/10/2004 Document Revised: 03/12/2011 Document Reviewed: 11/03/2010 [employer] Patient Information 2014 [employer].  

## 2012-06-25 ENCOUNTER — Ambulatory Visit: Payer: Self-pay | Admitting: Physician Assistant

## 2012-07-03 ENCOUNTER — Ambulatory Visit: Payer: Self-pay | Admitting: Physician Assistant

## 2012-07-16 ENCOUNTER — Encounter: Payer: Self-pay | Admitting: Physician Assistant

## 2012-07-16 ENCOUNTER — Ambulatory Visit (INDEPENDENT_AMBULATORY_CARE_PROVIDER_SITE_OTHER): Payer: Medicaid Other | Admitting: Physician Assistant

## 2012-07-16 VITALS — BP 155/90 | HR 110 | Temp 98.7°F | Ht 67.0 in | Wt 255.0 lb

## 2012-07-16 DIAGNOSIS — J069 Acute upper respiratory infection, unspecified: Secondary | ICD-10-CM

## 2012-07-16 MED ORDER — AMOXICILLIN-POT CLAVULANATE 875-125 MG PO TABS: 1.0000 | ORAL_TABLET | Freq: Two times a day (BID) | ORAL | Status: DC

## 2012-07-16 NOTE — Progress Notes (Signed)
Subjective:     Patient ID: Betty Lutz, female   DOB: March 27, 1965, 47 y.o.   MRN: [id number]  HPI Pt with prod cough, head congestion and R ear pain for 2 weeks She had used OTC meds with minimal relief of sx Pt with hx of NIDDM and has noticed sl increase in BS readings over the last several days   Review of Systems  All other systems reviewed and are negative.       Objective:   Physical Exam  Nursing note and vitals reviewed. Ears- L canal/TM nl; R canal nl TM with fluid and erythem Oral- no lesions, no increase in tonsil size, thick PND No cerv nodes Heart- RRR w/o M Lungs- CTA bilat     Assessment:     1. Acute URI        Plan:     Augmentin rx Fluids  Rest F/U with regular provider for regular diabetic appt

## 2012-07-16 NOTE — Patient Instructions (Addendum)

## 2012-07-18 ENCOUNTER — Other Ambulatory Visit: Payer: Self-pay | Admitting: Family Medicine

## 2012-07-21 ENCOUNTER — Encounter: Payer: Self-pay | Admitting: *Deleted

## 2012-07-21 NOTE — Telephone Encounter (Signed)
NOT ON EPIC MED LIST. BUT LAST REFILL 4/13. LAST OV 07/16/12.

## 2012-07-21 NOTE — Telephone Encounter (Signed)
This encounter was created in error - please disregard.

## 2012-07-22 ENCOUNTER — Other Ambulatory Visit: Payer: Self-pay | Admitting: *Deleted

## 2012-07-22 MED ORDER — FLUTICASONE PROPIONATE 50 MCG/ACT NA SUSP: 2.0000 | Freq: Every day | NASAL | Status: DC

## 2012-07-22 NOTE — Telephone Encounter (Signed)
Patient last seen in office on 7-16. Rx last filled on 05-01-11. Please advise.

## 2012-07-29 ENCOUNTER — Other Ambulatory Visit: Payer: Self-pay | Admitting: Family Medicine

## 2012-07-29 MED ORDER — FLUTICASONE PROPIONATE 50 MCG/ACT NA SUSP: 2.0000 | Freq: Every day | NASAL | Status: DC

## 2012-07-29 NOTE — Telephone Encounter (Signed)
Fluticasone ns sent in

## 2012-08-25 ENCOUNTER — Ambulatory Visit: Payer: Medicaid Other | Admitting: Nurse Practitioner

## 2012-08-28 ENCOUNTER — Ambulatory Visit (INDEPENDENT_AMBULATORY_CARE_PROVIDER_SITE_OTHER): Payer: Medicaid Other

## 2012-08-28 ENCOUNTER — Ambulatory Visit (INDEPENDENT_AMBULATORY_CARE_PROVIDER_SITE_OTHER): Payer: Medicaid Other | Admitting: Family Medicine

## 2012-08-28 ENCOUNTER — Encounter: Payer: Self-pay | Admitting: Family Medicine

## 2012-08-28 VITALS — BP 119/75 | HR 94 | Temp 98.5°F | Ht 67.5 in | Wt 255.8 lb

## 2012-08-28 DIAGNOSIS — S4980XA Other specified injuries of shoulder and upper arm, unspecified arm, initial encounter: Secondary | ICD-10-CM

## 2012-08-28 DIAGNOSIS — S4991XA Unspecified injury of right shoulder and upper arm, initial encounter: Secondary | ICD-10-CM

## 2012-08-28 DIAGNOSIS — E119 Type 2 diabetes mellitus without complications: Secondary | ICD-10-CM

## 2012-08-28 DIAGNOSIS — S46909A Unspecified injury of unspecified muscle, fascia and tendon at shoulder and upper arm level, unspecified arm, initial encounter: Secondary | ICD-10-CM

## 2012-08-28 DIAGNOSIS — E785 Hyperlipidemia, unspecified: Secondary | ICD-10-CM

## 2012-08-28 DIAGNOSIS — I10 Essential (primary) hypertension: Secondary | ICD-10-CM

## 2012-08-28 LAB — POCT CBC
Granulocyte percent: 62.3 %G (ref 37–80)
HCT, POC: 41 % (ref 37.7–47.9)
Hemoglobin: 13.7 g/dL (ref 12.2–16.2)
Lymph, poc: 2.9 (ref 0.6–3.4)
MCH, POC: 29.4 pg (ref 27–31.2)
MCHC: 33.5 g/dL (ref 31.8–35.4)
MCV: 87.9 fL (ref 80–97)
MPV: 7.8 fL (ref 0–99.8)
POC Granulocyte: 5.5 (ref 2–6.9)
POC LYMPH PERCENT: 32.4 %L (ref 10–50)
Platelet Count, POC: 259 10*3/uL (ref 142–424)
RBC: 4.7 M/uL (ref 4.04–5.48)
RDW, POC: 13.2 %
WBC: 8.8 10*3/uL (ref 4.6–10.2)

## 2012-08-28 LAB — POCT GLYCOSYLATED HEMOGLOBIN (HGB A1C): Hemoglobin A1C: 6

## 2012-08-28 MED ORDER — NAPROXEN 500 MG PO TABS: 500.0000 mg | ORAL_TABLET | Freq: Two times a day (BID) | ORAL | Status: DC

## 2012-08-28 MED ORDER — ATORVASTATIN CALCIUM 40 MG PO TABS: 40.0000 mg | ORAL_TABLET | Freq: Every day | ORAL | Status: DC
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
## 2012-08-28 NOTE — Patient Instructions (Signed)

## 2012-08-29 LAB — CMP14+EGFR
ALT: 18 IU/L (ref 0–32)
AST: 17 IU/L (ref 0–40)
Albumin/Globulin Ratio: 2 (ref 1.1–2.5)
Albumin: 4.6 g/dL (ref 3.5–5.5)
Alkaline Phosphatase: 67 IU/L (ref 39–117)
BUN/Creatinine Ratio: 10 (ref 9–23)
BUN: 9 mg/dL (ref 6–24)
CO2: 20 mmol/L (ref 18–29)
Calcium: 9.8 mg/dL (ref 8.7–10.2)
Chloride: 103 mmol/L (ref 97–108)
Creatinine, Ser: 0.92 mg/dL (ref 0.57–1.00)
GFR calc Af Amer: 87 mL/min/{1.73_m2} (ref >59)
GFR calc non Af Amer: 75 mL/min/{1.73_m2} (ref >59)
Globulin, Total: 2.3 g/dL (ref 1.5–4.5)
Glucose: 116 mg/dL — ABNORMAL HIGH (ref 65–99)
Potassium: 4.3 mmol/L (ref 3.5–5.2)
Sodium: 142 mmol/L (ref 134–144)
Total Bilirubin: 0.6 mg/dL (ref 0.0–1.2)
Total Protein: 6.9 g/dL (ref 6.0–8.5)

## 2012-08-29 LAB — LIPID PANEL
Chol/HDL Ratio: 2.8 ratio units (ref 0.0–4.4)
Cholesterol, Total: 106 mg/dL (ref 100–199)
HDL: 38 mg/dL — ABNORMAL LOW (ref >39)
LDL Calculated: 46 mg/dL (ref 0–99)
Triglycerides: 111 mg/dL (ref 0–149)
VLDL Cholesterol Cal: 22 mg/dL (ref 5–40)

## 2012-08-29 LAB — THYROID PANEL WITH TSH
Free Thyroxine Index: 2.3 (ref 1.2–4.9)
T3 Uptake Ratio: 28 % (ref 24–39)
T4, Total: 8.2 ug/dL (ref 4.5–12.0)
TSH: 1.5 u[IU]/mL (ref 0.450–4.500)

## 2012-09-23 ENCOUNTER — Telehealth: Payer: Self-pay | Admitting: Family Medicine

## 2012-09-23 NOTE — Telephone Encounter (Signed)
Samples of Benicar 40mg  given to patient per Ander Slade

## 2012-09-30 ENCOUNTER — Telehealth: Payer: Self-pay | Admitting: Family Medicine

## 2012-10-01 ENCOUNTER — Ambulatory Visit: Payer: Medicaid Other | Admitting: Family Medicine

## 2012-10-06 ENCOUNTER — Ambulatory Visit (INDEPENDENT_AMBULATORY_CARE_PROVIDER_SITE_OTHER): Payer: Medicaid Other

## 2012-10-06 ENCOUNTER — Encounter: Payer: Self-pay | Admitting: Family Medicine

## 2012-10-06 ENCOUNTER — Ambulatory Visit (INDEPENDENT_AMBULATORY_CARE_PROVIDER_SITE_OTHER): Payer: Medicaid Other | Admitting: Family Medicine

## 2012-10-06 VITALS — BP 137/82 | HR 95 | Temp 98.3°F | Ht 67.0 in | Wt 256.8 lb

## 2012-10-06 DIAGNOSIS — E669 Obesity, unspecified: Secondary | ICD-10-CM

## 2012-10-06 DIAGNOSIS — F3289 Other specified depressive episodes: Secondary | ICD-10-CM

## 2012-10-06 DIAGNOSIS — R059 Cough, unspecified: Secondary | ICD-10-CM

## 2012-10-06 DIAGNOSIS — E785 Hyperlipidemia, unspecified: Secondary | ICD-10-CM

## 2012-10-06 DIAGNOSIS — E119 Type 2 diabetes mellitus without complications: Secondary | ICD-10-CM

## 2012-10-06 DIAGNOSIS — I10 Essential (primary) hypertension: Secondary | ICD-10-CM

## 2012-10-06 DIAGNOSIS — R05 Cough: Secondary | ICD-10-CM

## 2012-10-06 DIAGNOSIS — R042 Hemoptysis: Secondary | ICD-10-CM

## 2012-10-06 DIAGNOSIS — M109 Gout, unspecified: Secondary | ICD-10-CM

## 2012-10-06 DIAGNOSIS — F17200 Nicotine dependence, unspecified, uncomplicated: Secondary | ICD-10-CM

## 2012-10-06 DIAGNOSIS — Z720 Tobacco use: Secondary | ICD-10-CM | POA: Insufficient documentation

## 2012-10-06 DIAGNOSIS — F32A Depression, unspecified: Secondary | ICD-10-CM

## 2012-10-06 DIAGNOSIS — N644 Mastodynia: Secondary | ICD-10-CM

## 2012-10-06 DIAGNOSIS — F329 Major depressive disorder, single episode, unspecified: Secondary | ICD-10-CM

## 2012-10-06 MED ORDER — SULFAMETHOXAZOLE-TMP DS 800-160 MG PO TABS: 1.0000 | ORAL_TABLET | Freq: Two times a day (BID) | ORAL | Status: DC

## 2012-10-06 NOTE — Progress Notes (Signed)
Patient ID: Betty Lutz, female   DOB: [birth year], 47 y.o.   MRN: [id number] SUBJECTIVE: CC: Chief Complaint  Patient presents with  . Acute Visit    cough c/o left breast sore- no draiage mammogram 2 yrs ago    HPI:  Smoker since age 12 years old. Smokes 5 to 6 cigarettes a day now. Has been congested with a sorethroat then coughing up phlegm with streaks of blood. No weight loss , no fever, no SOB, no wheezes. No chest pain.  Past Medical History  Diagnosis Date  . Hypertension   . High cholesterol   . Diabetes mellitus without complication   . Depression   . Gout    Past Surgical History  Procedure Laterality Date  . Cholecystectomy    . Tonsillectomy    . Abdominal hysterectomy     History   Social History  . Marital Status: Single    Spouse Name: N/A    Number of Children: N/A  . Years of Education: N/A   Occupational History  . Not on file.   Social History Main Topics  . Smoking status: Current Every Day Smoker -- 0.25 packs/day    Types: Cigarettes  . Smokeless tobacco: Never Used  . Alcohol Use: 0.6 oz/week    1 Cans of beer per week     Comment: daily  . Drug Use: No  . Sexual Activity: Not on file   Other Topics Concern  . Not on file   Social History Narrative  . No narrative on file   Family History  Problem Relation Age of Onset  . Cancer Father     prostate   Current Outpatient Prescriptions on File Prior to Visit  Medication Sig Dispense Refill  . allopurinol (ZYLOPRIM) 300 MG tablet Take 300 mg by mouth daily.      Marland Kitchen amLODipine (NORVASC) 5 MG tablet Take 5 mg by mouth daily.      Marland Kitchen amoxicillin-clavulanate (AUGMENTIN) 875-125 MG per tablet Take 1 tablet by mouth 2 (two) times daily.  20 tablet  0  . atorvastatin (LIPITOR) 40 MG tablet Take 1 tablet (40 mg total) by mouth daily.  34 tablet  1  . buPROPion (WELLBUTRIN XL) 300 MG 24 hr tablet Take 300 mg by mouth daily.      Marland Kitchen FLUoxetine (PROZAC) 10 MG capsule Take 10 mg by mouth daily.       . fluticasone (FLONASE) 50 MCG/ACT nasal spray Place 2 sprays into the nose daily.  16 g  5  . metFORMIN (GLUCOPHAGE XR) 500 MG 24 hr tablet Take 1 tablet (500 mg total) by mouth daily with breakfast.  34 tablet  1  . naproxen (NAPROSYN) 500 MG tablet Take 1 tablet (500 mg total) by mouth 2 (two) times daily with a meal.  30 tablet  0  . valsartan (DIOVAN) 320 MG tablet Take 320 mg by mouth daily.         No current facility-administered medications on file prior to visit.   No Known Allergies  There is no immunization history on file for this patient. Prior to Admission medications   Medication Sig Start Date End Date Taking? Authorizing Provider  allopurinol (ZYLOPRIM) 300 MG tablet Take 300 mg by mouth daily.    Historical Provider, MD  amLODipine (NORVASC) 5 MG tablet Take 5 mg by mouth daily.    Historical Provider, MD  amoxicillin-clavulanate (AUGMENTIN) 875-125 MG per tablet Take 1 tablet by mouth 2 (two) times  daily. 07/16/12   Inis Sizer, PA-C  atorvastatin (LIPITOR) 40 MG tablet Take 1 tablet (40 mg total) by mouth daily. 08/28/12   Deatra Canter, FNP  buPROPion (WELLBUTRIN XL) 300 MG 24 hr tablet Take 300 mg by mouth daily.    Historical Provider, MD  FLUoxetine (PROZAC) 10 MG capsule Take 10 mg by mouth daily.    Historical Provider, MD  fluticasone (FLONASE) 50 MCG/ACT nasal spray Place 2 sprays into the nose daily. 07/29/12   Deatra Canter, FNP  metFORMIN (GLUCOPHAGE XR) 500 MG 24 hr tablet Take 1 tablet (500 mg total) by mouth daily with breakfast. 05/28/12   Tammy Eckard, PHARMD  naproxen (NAPROSYN) 500 MG tablet Take 1 tablet (500 mg total) by mouth 2 (two) times daily with a meal. 08/28/12   Deatra Canter, FNP  valsartan (DIOVAN) 320 MG tablet Take 320 mg by mouth daily.      Historical Provider, MD     ROS: As above in the HPI. All other systems are stable or negative.  OBJECTIVE: APPEARANCE:  Patient in no acute distress.The patient appeared well  nourished and normally developed. Acyanotic. Waist: VITAL SIGNS:BP 137/82  Pulse 95  Temp(Src) 98.3 F (36.8 C) (Oral)  Ht 5\' 7"  (1.702 m)  Wt 256 lb 12.8 oz (116.484 kg)  BMI 40.21 kg/m2 AAF  SKIN: warm and  Dry without overt rashes, tattoos and scars  HEAD and Neck: without JVD, Head and scalp: normal Eyes:No scleral icterus. Fundi normal, eye movements normal. Ears: Auricle normal, canal normal, Tympanic membranes normal, insufflation normal. Nose: normal Throat: normal Neck & thyroid: normal  CHEST & LUNGS: Chest wall: normal Lungs: Coarse breath sounds. No wheezes , no Rales  Breast Exam: Appearance:  Skin : normal,Areolas normal;Nipples normal Dimples: Absent Palpation:tender Left upper outer quadrant; no Masses ; no Lumps Lymph Nodes: Axillary: Normal  CVS: Reveals the PMI to be normally located. Regular rhythm, First and Second Heart sounds are normal,  absence of murmurs, rubs or gallops. Peripheral vasculature: Radial pulses: normal Dorsal pedis pulses: normal Posterior pulses: normal  ABDOMEN:  Appearance: Obese Benign, no organomegaly, no masses, no Abdominal Aortic enlargement. No Guarding , no rebound. No Bruits. Bowel sounds: normal  EXTREMETIES: nonedematous.  MUSCULOSKELETAL:  Spine: normal Joints: intact  NEUROLOGIC: oriented to time,place and person; nonfocal.  ASSESSMENT: Cough - Plan: DG Chest 2 View, CT Chest Wo Contrast, sulfamethoxazole-trimethoprim (BACTRIM DS) 800-160 MG per tablet  Hemoptysis - Plan: CT Chest Wo Contrast, sulfamethoxazole-trimethoprim (BACTRIM DS) 800-160 MG per tablet  Tobacco user - Plan: CT Chest Wo Contrast, sulfamethoxazole-trimethoprim (BACTRIM DS) 800-160 MG per tablet  Mastodynia - left upper outer breast - Plan: MM Digital Diagnostic Bilat  DM type 2 (diabetes mellitus, type 2)  Gout  Dyslipidemia  Hypertension  Obesity, unspecified  Depression   PLAN:  [facility] reading (PRIMARY) by  Dr.  Modesto Charon. No acute findings.                            Orders Placed This Encounter  Procedures  . DG Chest 2 View    Standing Status: Future     Number of Occurrences: 1     Standing Expiration Date: 12/06/2013    Order Specific Question:  Reason for Exam (SYMPTOM  OR DIAGNOSIS REQUIRED)    Answer:  cough    Order Specific Question:  Is the patient pregnant?    Answer:  No  Order Specific Question:  Preferred imaging location?    Answer:  [facility]  . CT Chest Wo Contrast    Standing Status: Future     Number of Occurrences:      Standing Expiration Date: 12/06/2013    Order Specific Question:  Reason for Exam (SYMPTOM  OR DIAGNOSIS REQUIRED)    Answer:  low dose CT, smoker with hemoptysis, chest Xray unremarkable. r/o lung cancer    Order Specific Question:  Is the patient pregnant?    Answer:  No    Order Specific Question:  Preferred imaging location?    Answer:  [facility]  . MM Digital Diagnostic Bilat    Standing Status: Future     Number of Occurrences:      Standing Expiration Date: 12/06/2013    Order Specific Question:  Reason for Exam (SYMPTOM  OR DIAGNOSIS REQUIRED)    Answer:  mastodynia    Order Specific Question:  Is the patient pregnant?    Answer:  No    Order Specific Question:  Preferred imaging location?    Answer:  [facility]   Meds ordered this encounter  Medications  . sulfamethoxazole-trimethoprim (BACTRIM DS) 800-160 MG per tablet    Sig: Take 1 tablet by mouth 2 (two) times daily.    Dispense:  20 tablet    Refill:  0   Discussed with patient risk of lung cancer. Need for CT chest. Smoking cessation Handout given in the AV[facility] Handout on Mastodynia.       Dr Woodroe Mode Recommendations  For nutrition information, I recommend books:  1).Eat to Live by Dr Monico Hoar. 2).Prevent and Reverse Heart Disease by Dr Suzzette Righter. 3) Dr Katherina Right Book:  Program to Reverse Diabetes  Exercise recommendations are:  If  unable to walk, then the patient can exercise in a chair 3 times a day. By flapping arms like a bird gently and raising legs outwards to the front.  If ambulatory, the patient can go for walks for 30 minutes 3 times a week. Then increase the intensity and duration as tolerated.  Goal is to try to attain exercise frequency to 5 times a week.  If applicable: Best to perform resistance exercises (machines or weights) 2 days a week and cardio type exercises 3 days per week.  Diet and exercise and need for aggressive lifestyle changes discussed.  Return in about 2 weeks (around 10/20/2012) for Recheck medical problems.  Elana Jian P. Modesto Charon, M.D.

## 2012-10-06 NOTE — Patient Instructions (Addendum)
Smoking Cessation Quitting smoking is important to your health and has many advantages. However, it is not always easy to quit since nicotine is a very addictive drug. Often times, people try 3 times or more before being able to quit. This document explains the best ways for you to prepare to quit smoking. Quitting takes hard work and a lot of effort, but you can do it. ADVANTAGES OF QUITTING SMOKING  You will live longer, feel better, and live better.  Your body will feel the impact of quitting smoking almost immediately.  Within 20 minutes, blood pressure decreases. Your pulse returns to its normal level.  After 8 hours, carbon monoxide levels in the blood return to normal. Your oxygen level increases.  After 24 hours, the chance of having a heart attack starts to decrease. Your breath, hair, and body stop smelling like smoke.  After 48 hours, damaged nerve endings begin to recover. Your sense of taste and smell improve.  After 72 hours, the body is virtually free of nicotine. Your bronchial tubes relax and breathing becomes easier.  After 2 to 12 weeks, lungs can hold more air. Exercise becomes easier and circulation improves.  The risk of having a heart attack, stroke, cancer, or lung disease is greatly reduced.  After 1 year, the risk of coronary heart disease is cut in half.  After 5 years, the risk of stroke falls to the same as a nonsmoker.  After 10 years, the risk of lung cancer is cut in half and the risk of other cancers decreases significantly.  After 15 years, the risk of coronary heart disease drops, usually to the level of a nonsmoker.  If you are pregnant, quitting smoking will improve your chances of having a healthy baby.  The people you live with, especially any children, will be healthier.  You will have extra money to spend on things other than cigarettes. QUESTIONS TO THINK ABOUT BEFORE ATTEMPTING TO QUIT You may want to talk about your answers with your  caregiver.  Why do you want to quit?  If you tried to quit in the past, what helped and what did not?  What will be the most difficult situations for you after you quit? How will you plan to handle them?  Who can help you through the tough times? Your family? Friends? A caregiver?  What pleasures do you get from smoking? What ways can you still get pleasure if you quit? Here are some questions to ask your caregiver:  How can you help me to be successful at quitting?  What medicine do you think would be best for me and how should I take it?  What should I do if I need more help?  What is smoking withdrawal like? How can I get information on withdrawal? GET READY  Set a quit date.  Change your environment by getting rid of all cigarettes, ashtrays, matches, and lighters in your home, car, or work. Do not let people smoke in your home.  Review your past attempts to quit. Think about what worked and what did not. GET SUPPORT AND ENCOURAGEMENT You have a better chance of being successful if you have help. You can get support in many ways.  Tell your family, friends, and co-workers that you are going to quit and need their support. Ask them not to smoke around you.  Get individual, group, or telephone counseling and support. Programs are available at local hospitals and health centers. Call your local health department for   information about programs in your area.  Spiritual beliefs and practices may help some smokers quit.  Download a "quit meter" on your computer to keep track of quit statistics, such as how long you have gone without smoking, cigarettes not smoked, and money saved.  Get a self-help book about quitting smoking and staying off of tobacco. LEARN NEW SKILLS AND BEHAVIORS  Distract yourself from urges to smoke. Talk to someone, go for a walk, or occupy your time with a task.  Change your normal routine. Take a different route to work. Drink tea instead of coffee.  Eat breakfast in a different place.  Reduce your stress. Take a hot bath, exercise, or read a book.  Plan something enjoyable to do every day. Reward yourself for not smoking.  Explore interactive web-based programs that specialize in helping you quit. GET MEDICINE AND USE IT CORRECTLY Medicines can help you stop smoking and decrease the urge to smoke. Combining medicine with the above behavioral methods and support can greatly increase your chances of successfully quitting smoking.  Nicotine replacement therapy helps deliver nicotine to your body without the negative effects and risks of smoking. Nicotine replacement therapy includes nicotine gum, lozenges, inhalers, nasal sprays, and skin patches. Some may be available over-the-counter and others require a prescription.  Antidepressant medicine helps people abstain from smoking, but how this works is unknown. This medicine is available by prescription.  Nicotinic receptor partial agonist medicine simulates the effect of nicotine in your brain. This medicine is available by prescription. Ask your caregiver for advice about which medicines to use and how to use them based on your health history. Your caregiver will tell you what side effects to look out for if you choose to be on a medicine or therapy. Carefully read the information on the package. Do not use any other product containing nicotine while using a nicotine replacement product.  RELAPSE OR DIFFICULT SITUATIONS Most relapses occur within the first 3 months after quitting. Do not be discouraged if you start smoking again. Remember, most people try several times before finally quitting. You may have symptoms of withdrawal because your body is used to nicotine. You may crave cigarettes, be irritable, feel very hungry, cough often, get headaches, or have difficulty concentrating. The withdrawal symptoms are only temporary. They are strongest when you first quit, but they will go away within  10 14 days. To reduce the chances of relapse, try to:  Avoid drinking alcohol. Drinking lowers your chances of successfully quitting.  Reduce the amount of caffeine you consume. Once you quit smoking, the amount of caffeine in your body increases and can give you symptoms, such as a rapid heartbeat, sweating, and anxiety.  Avoid smokers because they can make you want to smoke.  Do not let weight gain distract you. Many smokers will gain weight when they quit, usually less than 10 pounds. Eat a healthy diet and stay active. You can always lose the weight gained after you quit.  Find ways to improve your mood other than smoking. FOR MORE INFORMATION  www.smokefree.gov  Document Released: 12/12/2000 Document Revised: 06/19/2011 Document Reviewed: 03/29/2011 [facility] Patient Information 2014 [place], [place].   Breast Tenderness Breast tenderness is a common complaint made by women of all ages. It is also called mastalgia or mastodynia, which means breast pain. The condition can range from mild discomfort to severe pain. It has a variety of causes. Your caregiver will find out the likely cause of your breast tenderness by examining your  breasts, asking you about symptoms and perhaps ordering some tests. Breast tenderness usually does not mean you have breast cancer. CAUSES  Breast tenderness has many possible causes. They include:  Premenstrual changes. A week to 10 days before your period, your breasts might ache or feel tender.  Other hormonal causes. These include:  When sexual and physical traits mature (puberty).  Pregnancy.  The time right before and the year after menopause (perimenopause).  The day when it has been 12 months since your last period (menopause).  Large breasts.  Infection (also called mastitis).  Birth control pills.  Breastfeeding. Tenderness can occur if the breasts are overfull with milk or if a milk duct is blocked.  Injury.  Fibrocystic breast  changes. This is not cancer (benign). It causes painful breasts that feel lumpy.  Fluid-filled sacs (cysts). Often cysts can be drained in your healthcare provider's office.  Fibroadenoma. This is a tumor that is not cancerous.  Medication side effects. Blood pressure drugs and diuretics (which increase urine flow) sometimes cause breast tenderness.  Previous breast surgery, such as a breast reduction.  Breast cancer. Cancer is rarely the reason breasts are tender. In most women, tenderness is caused by something else. DIAGNOSIS  Several methods can be used to find out why your breasts are tender. They include:  Visual inspection of the breasts.  Examination by hand.  Tests, such as:  Mammogram.  Ultrasound.  Biopsy.  Lab test of any fluid coming from the nipple.  Blood tests.  MRI. TREATMENT  Treatment is directed to the cause of the breast tenderness from doing nothing for minor discomfort, wearing a good support bra but also may include:  Taking over-the-counter medicines for pain or discomfort as directed by your caregiver.  Prescription medicine for breast tenderness related to:  Premenstrual.  Fibrocystic.  Puberty.  Pregnancy.  Menopause.  Previous breast surgery.  Large breasts.  Antibiotics for infection.  Birth control pills for fibrocystic and premenstrual changes.  More frequent feedings or pumping of the breasts and warm compresses for breast engorgement when nursing.  Cold and warm compresses and a good support bra for most breast injuries.  Breast cysts are sometimes drained with a needle (aspiration) or removed with minor surgery.  Fibroadenomas are usually removed with minor surgery.  Changing or stopping the medicine when it is responsible for causing the breast tenderness.  When breast cancer is present with or without causing pain, it is usually treated with major surgery (with or without radiation) and chemotherapy. HOME CARE  INSTRUCTIONS  Breast tenderness often can be handled at home. You can try:  Getting fitted for a new bra that provides more support, especially during exercise.  Wearing a more supportive or sports bra while sleeping when your breasts are very tender.  If you have a breast injury, using an ice pack for 15 to 20 minutes. Wrap the pack in a towel. Do not put the ice pack directly on your breast.  If your breasts are too full of milk as a result of breastfeeding, try:  Expressing milk either by hand or with a breast pump.  Applying a warm compress for relief.  Taking over-the-counter pain relievers, if this is OK with your caregiver.  Taking medicine that your caregiver prescribes. These might include antibiotics or birth control pills. Over the long term, your breast tenderness might be eased if you:  Cut down on caffeine.  Reduce the amount of fat in your diet. Also, learn how to  do breast examinations at home. This will help you tell when you have an unusual growth or lump that could cause tenderness. And keep a log of the days and times when your breasts are most tender. This will help you and your caregiver find the right solution. SEEK MEDICAL CARE IF:   Any part of your breast is hard, red and hot to the touch. This could be a sign of infection.  Fluid is coming out of your nipples (and you are not breastfeeding). Especially watch for blood or pus.  You have a fever as well as breast tenderness.  You have a new or painful lump in your breast that remains after your period ends.  You have tried to take care of the pain at home, but it has not gone away.  Your breast pain is getting worse. Or, the pain is making it hard to do the things you usually do during your day. Document Released: 12/01/2007 Document Revised: 03/12/2011 Document Reviewed: 12/01/2007 [facility] Patient Information 2014 [place], [place].        Dr Woodroe Mode Recommendations  For nutrition  information, I recommend books:  1).Eat to Live by Dr Monico Hoar. 2).Prevent and Reverse Heart Disease by Dr Suzzette Righter. 3) Dr Katherina Right Book:  Program to Reverse Diabetes  Exercise recommendations are:  If unable to walk, then the patient can exercise in a chair 3 times a day. By flapping arms like a bird gently and raising legs outwards to the front.  If ambulatory, the patient can go for walks for 30 minutes 3 times a week. Then increase the intensity and duration as tolerated.  Goal is to try to attain exercise frequency to 5 times a week.  If applicable: Best to perform resistance exercises (machines or weights) 2 days a week and cardio type exercises 3 days per week.

## 2012-10-15 ENCOUNTER — Encounter (HOSPITAL_COMMUNITY): Payer: Medicaid Other

## 2012-10-29 ENCOUNTER — Encounter (HOSPITAL_COMMUNITY): Payer: Medicaid Other

## 2012-10-30 ENCOUNTER — Other Ambulatory Visit: Payer: Self-pay

## 2012-10-30 DIAGNOSIS — R042 Hemoptysis: Secondary | ICD-10-CM

## 2012-10-31 ENCOUNTER — Ambulatory Visit: Payer: Medicaid Other | Admitting: Family Medicine

## 2012-11-04 ENCOUNTER — Other Ambulatory Visit: Payer: Self-pay

## 2012-11-04 MED ORDER — METFORMIN HCL ER 500 MG PO TB24: 500.0000 mg | ORAL_TABLET | Freq: Every day | ORAL | Status: DC

## 2012-11-19 ENCOUNTER — Ambulatory Visit (HOSPITAL_COMMUNITY)
Admission: RE | Admit: 2012-11-19 | Discharge: 2012-11-19 | Disposition: A | Discharge status: Home / Self Care | Payer: Medicaid Other | Source: Ambulatory Visit | Attending: Family Medicine | Admitting: Family Medicine

## 2012-11-19 ENCOUNTER — Other Ambulatory Visit: Payer: Self-pay | Admitting: Family Medicine

## 2012-11-19 DIAGNOSIS — Z720 Tobacco use: Secondary | ICD-10-CM

## 2012-11-19 DIAGNOSIS — R059 Cough, unspecified: Secondary | ICD-10-CM

## 2012-11-19 DIAGNOSIS — R05 Cough: Secondary | ICD-10-CM

## 2012-11-19 DIAGNOSIS — R042 Hemoptysis: Secondary | ICD-10-CM

## 2012-11-19 DIAGNOSIS — N63 Unspecified lump in unspecified breast: Secondary | ICD-10-CM | POA: Insufficient documentation

## 2012-11-19 DIAGNOSIS — N644 Mastodynia: Secondary | ICD-10-CM

## 2012-12-15 ENCOUNTER — Telehealth: Payer: Self-pay | Admitting: Family Medicine

## 2012-12-15 ENCOUNTER — Other Ambulatory Visit: Payer: Self-pay | Admitting: Family Medicine

## 2012-12-22 NOTE — Telephone Encounter (Signed)
Pt said already been taken care of.

## 2012-12-23 ENCOUNTER — Encounter: Payer: Self-pay | Admitting: Internal Medicine

## 2012-12-23 ENCOUNTER — Other Ambulatory Visit (INDEPENDENT_AMBULATORY_CARE_PROVIDER_SITE_OTHER): Payer: Medicaid Other

## 2012-12-23 ENCOUNTER — Ambulatory Visit (INDEPENDENT_AMBULATORY_CARE_PROVIDER_SITE_OTHER): Payer: Medicaid Other | Admitting: Internal Medicine

## 2012-12-23 VITALS — BP 140/78 | HR 76 | Temp 98.2°F | Ht 67.0 in | Wt 259.0 lb

## 2012-12-23 DIAGNOSIS — R042 Hemoptysis: Secondary | ICD-10-CM

## 2012-12-23 DIAGNOSIS — J310 Chronic rhinitis: Secondary | ICD-10-CM

## 2012-12-23 DIAGNOSIS — Z23 Encounter for immunization: Secondary | ICD-10-CM

## 2012-12-23 DIAGNOSIS — Z720 Tobacco use: Secondary | ICD-10-CM

## 2012-12-23 DIAGNOSIS — F17200 Nicotine dependence, unspecified, uncomplicated: Secondary | ICD-10-CM

## 2012-12-23 LAB — CBC WITH DIFFERENTIAL/PLATELET
Basophils Absolute: 0 10*3/uL (ref 0.0–0.1)
Eosinophils Absolute: 0.1 10*3/uL (ref 0.0–0.7)
HCT: 39.4 % (ref 36.0–46.0)
Lymphs Abs: 2.1 10*3/uL (ref 0.7–4.0)
MCHC: 33.6 g/dL (ref 30.0–36.0)
Monocytes Relative: 5.9 % (ref 3.0–12.0)
Platelets: 259 10*3/uL (ref 150.0–400.0)
RDW: 13.8 % (ref 11.5–14.6)

## 2012-12-23 LAB — BASIC METABOLIC PANEL
BUN: 9 mg/dL (ref 6–23)
Calcium: 8.9 mg/dL (ref 8.4–10.5)
GFR: 100.82 mL/min (ref >60.00)
Glucose, Bld: 99 mg/dL (ref 70–99)
Potassium: 4 mEq/L (ref 3.5–5.1)

## 2012-12-23 NOTE — Patient Instructions (Addendum)
Order- Ct chest with contrast      Dx hemoptysis NOS, tobacco user  Order lab- BMET, CBC w/ diff  Please stop smoking, before it causes you a cancer!  Flu vax  Pneumovax 23

## 2012-12-23 NOTE — Progress Notes (Signed)
12/23/12- 47 yoF smoker referred courtesy of Dr Nils Pyle and Dr Wong-hemopytsis Complicated by DM. Streak hemoptysis with hard cough in October. Treated for infection then with no further bleeding. No routine cough/ wheeze. Dyspnea on exertion is stable baseline. Post-nasal drip lying down, no sinus headache. 3 pillows. Denies prior hx asthma or pneumonia. Medical hx HBP, DM, cholesterol CXR 10/06/12 IMPRESSION:  No active cardiopulmonary disease.  Electronically Signed  By: Geanie Cooley M.D.  On: 10/06/2012 17:04  Prior to Admission medications   Medication Sig Start Date End Date Taking? Authorizing Provider  allopurinol (ZYLOPRIM) 300 MG tablet Take 300 mg by mouth daily.   Yes Historical Provider, MD  amLODipine (NORVASC) 5 MG tablet TAKE 1 TABLET EVERY DAY 12/15/12  Yes Ileana Ladd, MD  atorvastatin (LIPITOR) 40 MG tablet Take 1 tablet (40 mg total) by mouth daily. 08/28/12  Yes Deatra Canter, FNP  buPROPion (WELLBUTRIN XL) 300 MG 24 hr tablet Take 300 mg by mouth daily.   Yes Historical Provider, MD  DIOVAN 320 MG tablet TAKE 1 TABLET EVERY DAY 12/15/12  Yes Ileana Ladd, MD  FLUoxetine (PROZAC) 10 MG capsule Take 10 mg by mouth daily.   Yes Historical Provider, MD  fluticasone (FLONASE) 50 MCG/ACT nasal spray Place 2 sprays into the nose daily. 07/29/12  Yes Deatra Canter, FNP  metFORMIN (GLUCOPHAGE XR) 500 MG 24 hr tablet Take 1 tablet (500 mg total) by mouth daily with breakfast. 11/04/12  Yes Ileana Ladd, MD  naproxen (NAPROSYN) 500 MG tablet Take 1 tablet (500 mg total) by mouth 2 (two) times daily with a meal. 08/28/12  Yes Deatra Canter, FNP   Past Medical History  Diagnosis Date  . Hypertension   . High cholesterol   . Diabetes mellitus without complication   . Depression   . Gout    Past Surgical History  Procedure Laterality Date  . Cholecystectomy    . Tonsillectomy    . Abdominal hysterectomy     Family History  Problem Relation Age of Onset   . Cancer Father     prostate  ' History   Social History  . Marital Status: Single    Spouse Name: N/A    Number of Children: 1  . Years of Education: N/A   Occupational History  . unemployed    Social History Main Topics  . Smoking status: Current Every Day Smoker -- 0.25 packs/day    Types: Cigarettes  . Smokeless tobacco: Never Used  . Alcohol Use: No     Comment: perviously used 1 can of beer daily  . Drug Use: No  . Sexual Activity: Not on file   Other Topics Concern  . Not on file   Social History Narrative  . No narrative on file   ROS-see HPI Constitutional:   No-   weight loss, night sweats, fevers, chills, fatigue, lassitude. HEENT:   No-  headaches, difficulty swallowing, tooth/dental problems, sore throat,       No-  sneezing, itching, ear ache, +nasal congestion, post nasal drip,  CV:  No-   chest pain, orthopnea, PND, swelling in lower extremities, anasarca, dizziness, palpitations Resp: + shortness of breath with exertion or at rest.              No-   productive cough,  No non-productive cough,  + coughing up of blood.              No-   change  in color of mucus.  No- wheezing.   Skin: No-   rash or lesions. GI:  No-   heartburn, indigestion, abdominal pain, nausea, vomiting, diarrhea,                 change in bowel habits, loss of appetite GU: No-   dysuria, change in color of urine, no urgency or frequency.  No- flank pain. MS:  No-   joint pain or swelling.  No- decreased range of motion.  No- back pain. Neuro-     nothing unusual Psych:  No- change in mood or affect. No depression or anxiety.  No memory loss.  OBJ- Physical Exam General- Alert, Oriented, Affect-appropriate, Distress- none acute. Obese Skin- rash-none, lesions- none, excoriation- none Lymphadenopathy- none Head- atraumatic            Eyes- Gross vision intact, PERRLA, conjunctivae and secretions clear            Ears- Hearing, canals-normal            Nose- Clear, no-Septal  dev, mucus, polyps, erosion, perforation             Throat- Mallampati II , mucosa clear , drainage- none, tonsils- atrophic Neck- flexible , trachea midline, no stridor , thyroid nl, carotid no bruit Chest - symmetrical excursion , unlabored           Heart/CV- RRR , no murmur , no gallop  , no rub, nl s1 s2                           - JVD- none , edema- none, stasis changes- none, varices- none           Lung- +diminished, clear to P&A, wheeze- none, cough- none , dullness-none, rub- none           Chest wall-  Abd- tender-no, distended-no, bowel sounds-present, HSM- no Br/ Gen/ Rectal- Not done, not indicated Extrem- cyanosis- none, clubbing, none, atrophy- none, strength- nl Neuro- grossly intact to observation

## 2012-12-29 ENCOUNTER — Encounter: Payer: Self-pay | Admitting: Internal Medicine

## 2012-12-29 DIAGNOSIS — J310 Chronic rhinitis: Secondary | ICD-10-CM | POA: Insufficient documentation

## 2012-12-29 NOTE — Assessment & Plan Note (Signed)
DDX in obese smoker with dyspnea on exertion- benign hemorrhagic bronchitis, lower possibilities of lung Ca or PE Plan- smoking cessation counseled. CT angiogram with BMET and CBC, flu vax, pneumovax 23.

## 2012-12-29 NOTE — Assessment & Plan Note (Signed)
We discussed worry of bleeding as a motivator to stop smoking.

## 2012-12-29 NOTE — Assessment & Plan Note (Signed)
Allergic vs irritant/ vasomotor from smoking. Plan- smoking cessation. OTC antihistamine if needed.

## 2012-12-30 ENCOUNTER — Telehealth: Payer: Self-pay | Admitting: Family Medicine

## 2012-12-30 ENCOUNTER — Encounter: Payer: Self-pay | Admitting: Family Medicine

## 2012-12-30 ENCOUNTER — Ambulatory Visit (INDEPENDENT_AMBULATORY_CARE_PROVIDER_SITE_OTHER): Payer: Medicaid Other | Admitting: Family Medicine

## 2012-12-30 VITALS — BP 141/83 | HR 90 | Temp 99.1°F | Ht 67.0 in | Wt 253.0 lb

## 2012-12-30 DIAGNOSIS — J209 Acute bronchitis, unspecified: Secondary | ICD-10-CM

## 2012-12-30 DIAGNOSIS — R51 Headache: Secondary | ICD-10-CM

## 2012-12-30 MED ORDER — KETOROLAC TROMETHAMINE 60 MG/2ML IM SOLN
60.0000 mg | Freq: Once | INTRAMUSCULAR | Status: AC
  Administered 2012-12-30: 60 mg via INTRAMUSCULAR

## 2012-12-30 MED ORDER — AZITHROMYCIN 250 MG PO TABS: ORAL_TABLET | ORAL | Status: DC

## 2012-12-30 MED ORDER — BENZONATATE 100 MG PO CAPS: 200.0000 mg | ORAL_CAPSULE | Freq: Three times a day (TID) | ORAL | Status: DC | PRN

## 2012-12-30 NOTE — Patient Instructions (Signed)
Headache and Arthritis °Headaches and arthritis are common problems. This causes an interest in the possible role of arthritis in causing headaches. Several major forms of arthritis exist. Two of the most common types are: °· Rheumatoid arthritis. °· Osteoarthritis. °Rheumatoid arthritis may begin at any age. It is a condition in which the body attacks some of its own tissues, thinking they do not belong. This leads to destruction of the bony areas around the joints. This condition may afflict any of the body's joints. It usually produces a deformity of the joint. The hands and fingers no longer appear straight but often appear angled towards one side. In some cases, the spine may be involved. Most often it is the vertebrae of the neck (cervicalspine). The areas of the neck most commonly afflicted by rheumatoid arthritis are the first and second cervical vertebrae. Curiously, rheumatoid arthritis, though it often produces severe deformities, is not always painful.  °The more common form of arthritis is osteoarthritis. It is a wear-and-tear form of arthritis. It usually does not produce deformity of the joints or destruction of the bony tissues. Rather the ligaments weaken. They may be calcified due to the body's attempt to heal the damage. The larger joints of the body and those joints that take the most stress and strain are the most often affected. In the neck region this osteoarthritis usually involves the fifth, sixth and seventh vertebrae. This is because the effects of posture produce the most fatigue on them. Osteoarthritis is often more painful than rheumatoid arthritis.  °During workups for arthritis, a test evaluating inflammation, (the sedimentation rate) often is performed. In rheumatoid arthritis, this test will usually be elevated. Other tests for inflammation may also be elevated. In patients with osteoarthritis, x-rays of the neck or jaw joints will show changes from "lipping" of the vertebrae. This  is caused by calcium deposits in the ligaments. Or they may show narrowing of the space between the vertebrae, or spur formation (from calcium deposits). If severe, it may cause obstruction of the holes where the nerves pass from the spine to the body. In rheumatoid arthritis, dislocation of vertebrae may occur in the upper neck. CT scan and MRI in patients with osteoarthritis may show bulging of the discs that cushion the vertebrae. In the most severe cases, herniation of the discs may occur.  °Headaches, felt as a pain in the neck, may be caused by arthritis if the first, second or third vertebrae are involved. This condition is due to the nerves that supply the scalp only originating from this area of the spine. Neck pain itself, whether alone or coupled with headaches, can involve any portion of the neck. If the jaw is involved, the symptoms are similar to those of Temporomandibular Joint Syndrome (TMJ).  °The progressive severity of rheumatoid arthritis may be slowed by a variety of potent medications. In osteoarthritis, its progression is not usually hindered by medication. The following may be helpful in slowing the advancement of the disorder: °· Lifestyle adjustment. °· Exercise. °· Rest. °· Weight loss. °Medications, such as the nonsteroidal anti-inflammatory agents (NSAIDs), are useful. They may reduce the pain and improve the reduced motion which occurs in joints afflicted by arthritis. From some studies, the use of acetaminophen appears to be as effective in controlling the pain of arthritis as the NSAIDs. Physical modalities may also be useful for arthritis. They include: °· Heat. °· Massage. °· Exercise. °But physical therapy must be prescribed by a caregiver, just as most medications for   arthritis.  °Document Released: 03/10/2003 Document Revised: 03/12/2011 Document Reviewed: 08/07/2007 °ExitCare® Patient Information ©2014 [employer]. ° °

## 2012-12-30 NOTE — Progress Notes (Signed)
   Subjective:    Patient ID: Betty Lutz, female    DOB: Jan 21, 1965, 47 y.o.   MRN: [id number]  HPI  This 47 y.o. female presents for evaluation of headache, sinus congeston, and uri sx's for 3 days. She has been having low grade fever for the last 2 to 3 days.  Review of Systems No chest pain, SOB, HA, dizziness, vision change, N/V, diarrhea, constipation, dysuria, urinary urgency or frequency, myalgias, arthralgias or rash.     Objective:   Physical Exam Vital signs noted  Well developed well nourished female.  HEENT - Head atraumatic Normocephalic                Eyes - PERRLA, Conjuctiva - clear Sclera- Clear EOMI                Ears - EAC's Wnl TM's Wnl Gross Hearing WNL                Nose - Nares decreased patency and boggy                Throat - oropharanx wnl Respiratory - Lungs CTA bilateral Cardiac - RRR S1 and S2 without murmur GI - Abdomen soft Nontender and bowel sounds active x 4 Extremities - No edema. Neuro - Grossly intact.       Assessment & Plan:  Headache(784.0) - Plan: ketorolac (TORADOL) injection 60 mg  Acute bronchitis - Plan: azithromycin (ZITHROMAX) 250 MG tablet, benzonatate (TESSALON PERLES) 100 MG capsule.  Push po fluids, rest, tylenol and motrin otc prn as directed for fever, arthralgias, and myalgias.  Follow up prn if sx's continue or persist.  Deatra Canter FNP

## 2012-12-30 NOTE — Telephone Encounter (Signed)
appt today at 10:30

## 2013-01-05 ENCOUNTER — Inpatient Hospital Stay: Admission: RE | Admit: 2013-01-05 | Payer: Medicaid Other | Source: Ambulatory Visit

## 2013-01-14 ENCOUNTER — Ambulatory Visit (INDEPENDENT_AMBULATORY_CARE_PROVIDER_SITE_OTHER)
Admission: RE | Admit: 2013-01-14 | Discharge: 2013-01-14 | Disposition: A | Discharge status: Home / Self Care | Payer: Medicaid Other | Source: Ambulatory Visit | Attending: Internal Medicine | Admitting: Internal Medicine

## 2013-01-14 DIAGNOSIS — R042 Hemoptysis: Secondary | ICD-10-CM

## 2013-01-14 DIAGNOSIS — F17200 Nicotine dependence, unspecified, uncomplicated: Secondary | ICD-10-CM

## 2013-01-14 MED ORDER — IOHEXOL 300 MG/ML  SOLN
80.0000 mL | Freq: Once | INTRAMUSCULAR | Status: AC | PRN
  Administered 2013-01-14: 80 mL via INTRAVENOUS

## 2013-01-22 ENCOUNTER — Telehealth: Payer: Self-pay | Admitting: Internal Medicine

## 2013-01-22 NOTE — Telephone Encounter (Signed)
Pt was given CT results on 01/15/2013.  Pt wants to know if she still needs to keep her appt for f/u on 02/06/13.  Please advise

## 2013-01-22 NOTE — Telephone Encounter (Signed)
Spoke with the pt and notified of recs per CDY  Pt verbalized understanding  No more hemoptysis and she feels well so appt was cancelled

## 2013-01-22 NOTE — Telephone Encounter (Signed)
CT did not show a source of bleeding. If there is no more coughing of blood and she feels well, then she can cancel here, but be sure to follow up with Dr Modesto CharonWong soon. I recommend a CXR in 6 months, which he could do, unless needed sooner. If she is still coughing up blood then we should see her again.

## 2013-01-26 ENCOUNTER — Ambulatory Visit: Payer: Medicaid Other | Admitting: Family Medicine

## 2013-02-06 ENCOUNTER — Ambulatory Visit: Payer: Medicaid Other | Admitting: Internal Medicine

## 2013-03-09 ENCOUNTER — Other Ambulatory Visit: Payer: Self-pay | Admitting: Family Medicine

## 2013-03-10 ENCOUNTER — Ambulatory Visit (INDEPENDENT_AMBULATORY_CARE_PROVIDER_SITE_OTHER): Payer: Medicaid Other | Admitting: Nurse Practitioner

## 2013-03-10 VITALS — BP 150/88 | HR 84 | Temp 98.3°F | Ht 67.0 in | Wt 246.4 lb

## 2013-03-10 DIAGNOSIS — N390 Urinary tract infection, site not specified: Secondary | ICD-10-CM

## 2013-03-10 DIAGNOSIS — R350 Frequency of micturition: Secondary | ICD-10-CM

## 2013-03-10 LAB — POCT URINALYSIS DIPSTICK
Bilirubin, UA: NEGATIVE
Glucose, UA: NEGATIVE
Ketones, UA: NEGATIVE
Nitrite, UA: NEGATIVE
PROTEIN UA: NEGATIVE
Spec Grav, UA: 1.015
UROBILINOGEN UA: NEGATIVE
pH, UA: 6

## 2013-03-10 LAB — POCT UA - MICROSCOPIC ONLY
BACTERIA, U MICROSCOPIC: NEGATIVE
CASTS, UR, LPF, POC: NEGATIVE
CRYSTALS, UR, HPF, POC: NEGATIVE
MUCUS UA: NEGATIVE
YEAST UA: NEGATIVE

## 2013-03-10 MED ORDER — NITROFURANTOIN MONOHYD MACRO 100 MG PO CAPS: 100.0000 mg | ORAL_CAPSULE | Freq: Two times a day (BID) | ORAL | Status: DC

## 2013-03-10 NOTE — Patient Instructions (Signed)
Urinary Tract Infection  Urinary tract infections (UTIs) can develop anywhere along your urinary tract. Your urinary tract is your body's drainage system for removing wastes and extra water. Your urinary tract includes two kidneys, two ureters, a bladder, and a urethra. Your kidneys are a pair of bean-shaped organs. Each kidney is about the size of your fist. They are located below your ribs, one on each side of your spine.  CAUSES  Infections are caused by microbes, which are microscopic organisms, including fungi, viruses, and bacteria. These organisms are so small that they can only be seen through a microscope. Bacteria are the microbes that most commonly cause UTIs.  SYMPTOMS   Symptoms of UTIs may vary by age and gender of the patient and by the location of the infection. Symptoms in young women typically include a frequent and intense urge to urinate and a painful, burning feeling in the bladder or urethra during urination. Older women and men are more likely to be tired, shaky, and weak and have muscle aches and abdominal pain. A fever may mean the infection is in your kidneys. Other symptoms of a kidney infection include pain in your back or sides below the ribs, nausea, and vomiting.  DIAGNOSIS  To diagnose a UTI, your caregiver will ask you about your symptoms. Your caregiver also will ask to provide a urine sample. The urine sample will be tested for bacteria and white blood cells. White blood cells are made by your body to help fight infection.  TREATMENT   Typically, UTIs can be treated with medication. Because most UTIs are caused by a bacterial infection, they usually can be treated with the use of antibiotics. The choice of antibiotic and length of treatment depend on your symptoms and the type of bacteria causing your infection.  HOME CARE INSTRUCTIONS   If you were prescribed antibiotics, take them exactly as your caregiver instructs you. Finish the medication even if you feel better after you  have only taken some of the medication.   Drink enough water and fluids to keep your urine clear or pale yellow.   Avoid caffeine, tea, and carbonated beverages. They tend to irritate your bladder.   Empty your bladder often. Avoid holding urine for long periods of time.   Empty your bladder before and after sexual intercourse.   After a bowel movement, women should cleanse from front to back. Use each tissue only once.  SEEK MEDICAL CARE IF:    You have back pain.   You develop a fever.   Your symptoms do not begin to resolve within 3 days.  SEEK IMMEDIATE MEDICAL CARE IF:    You have severe back pain or lower abdominal pain.   You develop chills.   You have nausea or vomiting.   You have continued burning or discomfort with urination.  MAKE SURE YOU:    Understand these instructions.   Will watch your condition.   Will get help right away if you are not doing well or get worse.  Document Released: 09/27/2004 Document Revised: 06/19/2011 Document Reviewed: 01/26/2011  ExitCare Patient Information 2014 [employer].

## 2013-03-10 NOTE — Progress Notes (Signed)
   Subjective:    Patient ID: Betty BradleyLorrie A Lottes, female    DOB: 04/16/1965, 48 y.o.   MRN: [id number]  HPI Patient presents today complaining of possible uti. Symptoms began yesterday and include frequency and abdominal tenderness. Denies dysuria, urgency, hematuria, or fever. No treatment tried.     Review of Systems  Respiratory: Negative for shortness of breath.   Cardiovascular: Negative for chest pain.  Genitourinary: Positive for frequency and flank pain. Negative for dysuria, urgency and hematuria.  All other systems reviewed and are negative.       Objective:   Physical Exam  Constitutional: She appears well-developed and well-nourished.  Cardiovascular: Normal rate and regular rhythm.   Pulmonary/Chest: Effort normal and breath sounds normal.  Abdominal: Soft. Bowel sounds are normal. There is no tenderness.     BP 150/88  Pulse 84  Temp(Src) 98.3 F (36.8 C) (Oral)  Ht 5\' 7"  (1.702 m)  Wt 246 lb 6.4 oz (111.766 kg)  BMI 38.58 kg/m2  Results for orders placed in visit on 03/10/13  POCT UA - MICROSCOPIC ONLY      Result Value Ref Range   WBC, Ur, HPF, POC 10-15     RBC, urine, microscopic 1-5     Bacteria, U Microscopic neg     Mucus, UA neg     Epithelial cells, urine per micros occ     Crystals, Ur, HPF, POC neg     Casts, Ur, LPF, POC neg     Yeast, UA neg    POCT URINALYSIS DIPSTICK      Result Value Ref Range   Color, UA yellow     Clarity, UA clear     Glucose, UA neg     Bilirubin, UA neg     Ketones, UA neg     Spec Grav, UA 1.015     Blood, UA trace     pH, UA 6.0     Protein, UA neg     Urobilinogen, UA negative     Nitrite, UA neg     Leukocytes, UA moderate (2+)         Assessment & Plan:   1. UTI (urinary tract infection)   2. Frequent urination    Orders Placed This Encounter  Procedures  . POCT UA - Microscopic Only  . POCT urinalysis dipstick   Force fluids AZO over the counter X2 days RTO prn Culture pending Void after  intercourse Wipe front to back No bubble baths  Mary-Margaret Daphine DeutscherMartin, FNP

## 2013-03-10 NOTE — Telephone Encounter (Signed)
Patient NTBS for follow up and lab work  

## 2013-03-10 NOTE — Telephone Encounter (Signed)
Seen 03/10/13 MMM Last glucose 12/23/12

## 2013-03-11 ENCOUNTER — Other Ambulatory Visit: Payer: Self-pay | Admitting: Family Medicine

## 2013-03-11 ENCOUNTER — Telehealth: Payer: Self-pay | Admitting: *Deleted

## 2013-03-11 ENCOUNTER — Telehealth: Payer: Self-pay | Admitting: Nurse Practitioner

## 2013-03-11 MED ORDER — CIPROFLOXACIN HCL 500 MG PO TABS: 500.0000 mg | ORAL_TABLET | Freq: Two times a day (BID) | ORAL | Status: DC

## 2013-03-11 NOTE — Telephone Encounter (Signed)
Patient aware,need to schedule an appointment.

## 2013-03-12 MED ORDER — NITROFURANTOIN MONOHYD MACRO 100 MG PO CAPS: 100.0000 mg | ORAL_CAPSULE | Freq: Two times a day (BID) | ORAL | Status: DC

## 2013-03-12 NOTE — Telephone Encounter (Signed)
Patient was originally put on macrobid and that is what she wasn't feeling good with and Bill sent in rx for cipro

## 2013-03-12 NOTE — Telephone Encounter (Signed)
Stop cipro- macrobid rx sent to pharmacy

## 2013-03-27 ENCOUNTER — Ambulatory Visit: Payer: Medicaid Other | Admitting: Family Medicine

## 2013-03-31 ENCOUNTER — Other Ambulatory Visit: Payer: Self-pay | Admitting: Family Medicine

## 2013-04-14 ENCOUNTER — Other Ambulatory Visit: Payer: Self-pay | Admitting: Family Medicine

## 2013-04-14 DIAGNOSIS — N6009 Solitary cyst of unspecified breast: Secondary | ICD-10-CM

## 2013-04-16 ENCOUNTER — Ambulatory Visit: Payer: Medicaid Other | Admitting: Family Medicine

## 2013-04-17 ENCOUNTER — Other Ambulatory Visit: Payer: Self-pay | Admitting: Nurse Practitioner

## 2013-04-20 NOTE — Telephone Encounter (Signed)
Patient needs to be seen. Has exceeded time since last visit. Limited quantity refilled. Needs to bring all medications to next appointment.   

## 2013-04-20 NOTE — Telephone Encounter (Signed)
Last ov 03/10/13. Last AIC 8/14- 6.0. Ntbs

## 2013-04-21 ENCOUNTER — Encounter: Payer: Self-pay | Admitting: *Deleted

## 2013-04-30 ENCOUNTER — Other Ambulatory Visit: Payer: Self-pay | Admitting: Family Medicine

## 2013-05-06 ENCOUNTER — Ambulatory Visit
Admission: RE | Admit: 2013-05-06 | Discharge: 2013-05-06 | Disposition: A | Discharge status: Home / Self Care | Payer: Medicaid Other | Source: Ambulatory Visit | Attending: Family Medicine | Admitting: Family Medicine

## 2013-05-06 ENCOUNTER — Encounter (INDEPENDENT_AMBULATORY_CARE_PROVIDER_SITE_OTHER): Payer: Self-pay

## 2013-05-06 DIAGNOSIS — N6009 Solitary cyst of unspecified breast: Secondary | ICD-10-CM

## 2013-05-07 ENCOUNTER — Ambulatory Visit: Payer: Medicaid Other | Admitting: Family Medicine

## 2013-05-07 ENCOUNTER — Telehealth: Payer: Self-pay | Admitting: Family Medicine

## 2013-05-07 NOTE — Telephone Encounter (Signed)
appt moved to May 15 with Annette StableBill

## 2013-05-15 ENCOUNTER — Encounter: Payer: Self-pay | Admitting: Family Medicine

## 2013-05-15 ENCOUNTER — Ambulatory Visit (INDEPENDENT_AMBULATORY_CARE_PROVIDER_SITE_OTHER): Payer: Medicaid Other | Admitting: Family Medicine

## 2013-05-15 VITALS — BP 111/70 | HR 80 | Temp 98.7°F | Wt 246.6 lb

## 2013-05-15 DIAGNOSIS — I10 Essential (primary) hypertension: Secondary | ICD-10-CM

## 2013-05-15 DIAGNOSIS — E785 Hyperlipidemia, unspecified: Secondary | ICD-10-CM

## 2013-05-15 DIAGNOSIS — E119 Type 2 diabetes mellitus without complications: Secondary | ICD-10-CM

## 2013-05-15 DIAGNOSIS — R5383 Other fatigue: Secondary | ICD-10-CM

## 2013-05-15 DIAGNOSIS — R5381 Other malaise: Secondary | ICD-10-CM

## 2013-05-15 LAB — POCT CBC
Granulocyte percent: 72.2 %G (ref 37–80)
HCT, POC: 40.1 % (ref 37.7–47.9)
Hemoglobin: 13.1 g/dL (ref 12.2–16.2)
Lymph, poc: 2.3 (ref 0.6–3.4)
MCH, POC: 29.9 pg (ref 27–31.2)
MCHC: 32.8 g/dL (ref 31.8–35.4)
MCV: 91.1 fL (ref 80–97)
MPV: 8 fL (ref 0–99.8)
POC Granulocyte: 6.1 (ref 2–6.9)
POC LYMPH PERCENT: 27.1 %L (ref 10–50)
Platelet Count, POC: 265 10*3/uL (ref 142–424)
RBC: 4.4 M/uL (ref 4.04–5.48)
RDW, POC: 14.7 %
WBC: 8.4 10*3/uL (ref 4.6–10.2)

## 2013-05-15 LAB — POCT GLYCOSYLATED HEMOGLOBIN (HGB A1C): Hemoglobin A1C: 5.6

## 2013-05-15 LAB — POCT UA - MICROALBUMIN: Microalbumin Ur, POC: 20 mg/L

## 2013-05-15 MED ORDER — METFORMIN HCL ER 500 MG PO TB24: ORAL_TABLET | ORAL | Status: DC

## 2013-05-15 NOTE — Progress Notes (Signed)
   Subjective:    Patient ID: Betty Lutz, female    DOB: 27-Sep-1965, 48 y.o.   MRN: [id number]  HPI This 48 y.o. female presents for evaluation of diabetes, hypertension, and hyperlipidemia.  She need diabetes supplies and is asking for rx for diabetes shoes.  She has no acute problems today.   Review of Systems    No chest pain, SOB, HA, dizziness, vision change, N/V, diarrhea, constipation, dysuria, urinary urgency or frequency, myalgias, arthralgias or rash.  Objective:   Physical Exam  Vital signs noted  Well developed well nourished female.  HEENT - Head atraumatic Normocephalic                Eyes - PERRLA, Conjuctiva - clear Sclera- Clear EOMI                Ears - EAC's Wnl TM's Wnl Gross Hearing WNL                Nose - Nares patent                 Throat - oropharanx wnl Respiratory - Lungs CTA bilateral Cardiac - RRR S1 and S2 without murmur GI - Abdomen soft Nontender and bowel sounds active x 4 Extremities - No edema. Neuro - Grossly intact.      Assessment & Plan:  Diabetes mellitus, type 2 - Plan: POCT UA - Microalbumin, CMP14+EGFR, Lipid panel, POCT glycosylated hemoglobin (Hb A1C), metFORMIN (GLUCOPHAGE-XR) 500 MG 24 hr tablet Glucometer test strips lancets and supplies etc for fsbs qd and rx given Diabetes shoes rx needs to be signed by MD and patient will need to follow up.  Hypertension - Plan: POCT CBC, CMP14+EGFR  Hyperlipemia - Plan: CMP14+EGFR, Lipid panel  Fatigue - Plan: POCT CBC, Vit D  25 hydroxy (rtn osteoporosis monitoring), Thyroid Panel With TSH  Follow up in 4 months  Lysbeth Penner FNP

## 2013-05-15 NOTE — Addendum Note (Signed)
Addended by: Prescott GumLAND, Danielys Madry M on: 05/15/2013 08:44 AM   Modules accepted: Orders

## 2013-05-16 LAB — CMP14+EGFR
ALT: 13 IU/L (ref 0–32)
AST: 10 IU/L (ref 0–40)
Albumin/Globulin Ratio: 2.1 (ref 1.1–2.5)
Albumin: 4.7 g/dL (ref 3.5–5.5)
Alkaline Phosphatase: 63 IU/L (ref 39–117)
BUN/Creatinine Ratio: 13 (ref 9–23)
BUN: 12 mg/dL (ref 6–24)
CO2: 20 mmol/L (ref 18–29)
Calcium: 9.6 mg/dL (ref 8.7–10.2)
Chloride: 105 mmol/L (ref 97–108)
Creatinine, Ser: 0.89 mg/dL (ref 0.57–1.00)
GFR calc Af Amer: 90 mL/min/{1.73_m2} (ref >59)
GFR calc non Af Amer: 78 mL/min/{1.73_m2} (ref >59)
Globulin, Total: 2.2 g/dL (ref 1.5–4.5)
Glucose: 113 mg/dL — ABNORMAL HIGH (ref 65–99)
Potassium: 4.9 mmol/L (ref 3.5–5.2)
Sodium: 140 mmol/L (ref 134–144)
Total Bilirubin: 0.7 mg/dL (ref 0.0–1.2)
Total Protein: 6.9 g/dL (ref 6.0–8.5)

## 2013-05-16 LAB — VITAMIN D 25 HYDROXY (VIT D DEFICIENCY, FRACTURES): Vit D, 25-Hydroxy: 8.3 ng/mL — ABNORMAL LOW (ref 30.0–100.0)

## 2013-05-16 LAB — LIPID PANEL
Chol/HDL Ratio: 4.5 ratio units — ABNORMAL HIGH (ref 0.0–4.4)
Cholesterol, Total: 197 mg/dL (ref 100–199)
HDL: 44 mg/dL (ref >39)
LDL Calculated: 118 mg/dL — ABNORMAL HIGH (ref 0–99)
Triglycerides: 176 mg/dL — ABNORMAL HIGH (ref 0–149)
VLDL Cholesterol Cal: 35 mg/dL (ref 5–40)

## 2013-05-16 LAB — THYROID PANEL WITH TSH
Free Thyroxine Index: 2.4 (ref 1.2–4.9)
T3 Uptake Ratio: 28 % (ref 24–39)
T4, Total: 8.7 ug/dL (ref 4.5–12.0)
TSH: 1.06 u[IU]/mL (ref 0.450–4.500)

## 2013-05-16 LAB — MICROALBUMIN, URINE: Microalbumin, Urine: <3 ug/mL (ref 0.0–17.0)

## 2013-05-19 ENCOUNTER — Telehealth: Payer: Self-pay | Admitting: Family Medicine

## 2013-05-19 ENCOUNTER — Other Ambulatory Visit: Payer: Self-pay | Admitting: Family Medicine

## 2013-05-19 MED ORDER — VITAMIN D (ERGOCALCIFEROL) 1.25 MG (50000 UNIT) PO CAPS: 50000.0000 [IU] | ORAL_CAPSULE | ORAL | Status: DC

## 2013-05-26 ENCOUNTER — Telehealth: Payer: Self-pay | Admitting: Family Medicine

## 2013-05-27 ENCOUNTER — Other Ambulatory Visit: Payer: Self-pay | Admitting: Family Medicine

## 2013-05-27 DIAGNOSIS — E1110 Type 2 diabetes mellitus with ketoacidosis without coma: Secondary | ICD-10-CM

## 2013-05-27 NOTE — Telephone Encounter (Signed)
Referral was put inp

## 2013-05-27 NOTE — Telephone Encounter (Signed)
Referral to podiatry  

## 2013-06-05 ENCOUNTER — Ambulatory Visit (INDEPENDENT_AMBULATORY_CARE_PROVIDER_SITE_OTHER): Payer: Medicaid Other | Admitting: Podiatry

## 2013-06-05 ENCOUNTER — Encounter: Payer: Self-pay | Admitting: Podiatry

## 2013-06-05 VITALS — BP 130/91 | HR 97 | Ht 67.0 in | Wt 246.0 lb

## 2013-06-05 DIAGNOSIS — M79609 Pain in unspecified limb: Secondary | ICD-10-CM

## 2013-06-05 DIAGNOSIS — L84 Corns and callosities: Secondary | ICD-10-CM | POA: Insufficient documentation

## 2013-06-05 DIAGNOSIS — M79606 Pain in leg, unspecified: Secondary | ICD-10-CM | POA: Insufficient documentation

## 2013-06-05 DIAGNOSIS — M216X9 Other acquired deformities of unspecified foot: Secondary | ICD-10-CM

## 2013-06-05 DIAGNOSIS — M21969 Unspecified acquired deformity of unspecified lower leg: Secondary | ICD-10-CM | POA: Insufficient documentation

## 2013-06-05 NOTE — Progress Notes (Signed)
Subjective: 48 year old female presents requesting calluses trimmed. Both feet have been painful to walk x 1 month. She is not required to be on feet long hours. Been diabetic for 3 years and her blood sugar is in 120's.   Review of Systems - General ROS: negative for - chills, fatigue, fever, night sweats, sleep disturbance, weight gain or weight loss Ophthalmic ROS: negative ENT ROS: negative Allergy and Immunology ROS: negative Breast ROS: negative for breast lumps Respiratory ROS: no cough, shortness of breath, or wheezing Cardiovascular ROS: no chest pain or dyspnea on exertion Gastrointestinal ROS: no abdominal pain, change in bowel habits, or black or bloody stools Genito-Urinary ROS: no dysuria, trouble voiding, or hematuria Musculoskeletal ROS: negative Neurological ROS: no TIA or stroke symptoms Dermatological ROS: negative.  Objective: Dermatologic:  Broad circular plantar callus under 1st and 5th MPJ area bilateral. No open skin lesions noted. Vascular: All pedal pulses are palpable. No edema or erythema noted.  High arched cavus type foot with tight Achilles tendon on right.  Neurologic:  ll epicritic and tactile sensations grossly intact.  Normal response to DTR, Monofilament sensory and vibratory testing bilateral. Orthopedic: High arched cavus type foot with limited rearfoot joint motion and tight Achilles tendon R>L.   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 Plantar callus sub first and fifth MPJ area bilateral, symptomatic. Plantar flexed first and 5th Metatarsal bilateral.  Plan: Reviewed clinical findings and available options such as orthotic shoe inserts, proper shoe gear, and periodic debridement.  Advised to do stretch exercise for tight Achilles tendon on right.  Instructed to wear well supported tennis shoes wit Orthotic (OTC or custom). All calluses debrided. Return as needed.

## 2013-06-05 NOTE — Patient Instructions (Signed)
Seen for painful calluses. All calluses debrided. May benefit from orthotic shoe inserts. No other problems noted. Return as needed.

## 2013-08-28 ENCOUNTER — Ambulatory Visit: Payer: Self-pay | Admitting: Family Medicine

## 2013-09-10 ENCOUNTER — Other Ambulatory Visit (HOSPITAL_COMMUNITY): Payer: Self-pay | Admitting: Orthopaedic Surgery

## 2013-09-10 DIAGNOSIS — M79671 Pain in right foot: Secondary | ICD-10-CM

## 2013-09-17 ENCOUNTER — Encounter (HOSPITAL_COMMUNITY): Payer: Self-pay

## 2013-09-17 ENCOUNTER — Encounter (HOSPITAL_COMMUNITY)
Admission: RE | Admit: 2013-09-17 | Discharge: 2013-09-17 | Disposition: A | Discharge status: Home / Self Care | Payer: Medicaid Other | Source: Ambulatory Visit | Attending: Orthopaedic Surgery | Admitting: Orthopaedic Surgery

## 2013-09-17 DIAGNOSIS — M79609 Pain in unspecified limb: Secondary | ICD-10-CM | POA: Insufficient documentation

## 2013-09-17 DIAGNOSIS — M79671 Pain in right foot: Secondary | ICD-10-CM

## 2013-09-17 MED ORDER — TECHNETIUM TC 99M MEDRONATE IV KIT
25.0000 | PACK | Freq: Once | INTRAVENOUS | Status: AC | PRN
  Administered 2013-09-17: 25 via INTRAVENOUS

## 2013-09-21 ENCOUNTER — Ambulatory Visit: Payer: Self-pay | Admitting: Family Medicine

## 2013-10-09 ENCOUNTER — Other Ambulatory Visit: Payer: Self-pay | Admitting: *Deleted

## 2013-10-09 MED ORDER — AMLODIPINE BESYLATE 5 MG PO TABS: ORAL_TABLET | ORAL | Status: DC

## 2013-10-12 ENCOUNTER — Telehealth: Payer: Self-pay | Admitting: Family Medicine

## 2013-12-08 ENCOUNTER — Other Ambulatory Visit: Payer: Self-pay | Admitting: Family Medicine

## 2013-12-15 ENCOUNTER — Ambulatory Visit: Payer: Medicaid Other | Admitting: Family Medicine

## 2013-12-16 ENCOUNTER — Ambulatory Visit: Payer: Medicaid Other | Admitting: Family Medicine

## 2014-01-26 ENCOUNTER — Telehealth: Payer: Self-pay | Admitting: Family Medicine

## 2014-01-26 NOTE — Telephone Encounter (Signed)
Valsartan is needing prior authorization. Please call patient she only has 2 pills left

## 2014-02-12 ENCOUNTER — Ambulatory Visit: Payer: Medicaid Other | Admitting: Family

## 2014-02-17 ENCOUNTER — Ambulatory Visit: Payer: Medicaid Other | Admitting: Family

## 2014-03-11 ENCOUNTER — Other Ambulatory Visit: Payer: Self-pay | Admitting: Family Medicine

## 2014-04-15 ENCOUNTER — Ambulatory Visit: Payer: Medicaid Other | Admitting: Family

## 2014-04-20 ENCOUNTER — Ambulatory Visit: Payer: Medicaid Other | Admitting: Family

## 2014-04-20 ENCOUNTER — Telehealth: Payer: Self-pay | Admitting: Family

## 2014-04-20 MED ORDER — VALSARTAN 320 MG PO TABS: 320.0000 mg | ORAL_TABLET | Freq: Every day | ORAL | Status: DC

## 2014-04-20 NOTE — Telephone Encounter (Signed)
no more refills without being seen  

## 2014-04-20 NOTE — Telephone Encounter (Signed)
Patient aware that she will need to be seen for any further refills 

## 2014-04-20 NOTE — Telephone Encounter (Signed)
Mae's sister, not seen since 05/2013

## 2014-04-23 ENCOUNTER — Ambulatory Visit: Payer: Medicaid Other | Admitting: Family

## 2014-07-02 ENCOUNTER — Ambulatory Visit: Payer: Medicaid Other | Admitting: Physician Assistant

## 2014-07-08 ENCOUNTER — Ambulatory Visit: Payer: Medicaid Other | Admitting: Physician Assistant

## 2014-07-16 ENCOUNTER — Encounter: Payer: Self-pay | Admitting: Family

## 2014-07-16 ENCOUNTER — Ambulatory Visit: Payer: Medicaid Other | Admitting: Family Medicine

## 2014-07-19 ENCOUNTER — Encounter: Payer: Self-pay | Admitting: Family

## 2014-08-05 ENCOUNTER — Ambulatory Visit (INDEPENDENT_AMBULATORY_CARE_PROVIDER_SITE_OTHER): Payer: Medicaid Other | Admitting: Family

## 2014-08-05 ENCOUNTER — Encounter: Payer: Self-pay | Admitting: Family

## 2014-08-05 ENCOUNTER — Encounter (INDEPENDENT_AMBULATORY_CARE_PROVIDER_SITE_OTHER): Payer: Self-pay

## 2014-08-05 VITALS — BP 164/98 | HR 93 | Temp 98.2°F | Ht 67.0 in | Wt 216.0 lb

## 2014-08-05 DIAGNOSIS — M10079 Idiopathic gout, unspecified ankle and foot: Secondary | ICD-10-CM

## 2014-08-05 DIAGNOSIS — E1165 Type 2 diabetes mellitus with hyperglycemia: Secondary | ICD-10-CM | POA: Diagnosis not present

## 2014-08-05 DIAGNOSIS — F329 Major depressive disorder, single episode, unspecified: Secondary | ICD-10-CM | POA: Diagnosis not present

## 2014-08-05 DIAGNOSIS — I10 Essential (primary) hypertension: Secondary | ICD-10-CM | POA: Diagnosis not present

## 2014-08-05 DIAGNOSIS — J310 Chronic rhinitis: Secondary | ICD-10-CM

## 2014-08-05 DIAGNOSIS — M109 Gout, unspecified: Secondary | ICD-10-CM

## 2014-08-05 DIAGNOSIS — E559 Vitamin D deficiency, unspecified: Secondary | ICD-10-CM | POA: Diagnosis not present

## 2014-08-05 DIAGNOSIS — F411 Generalized anxiety disorder: Secondary | ICD-10-CM

## 2014-08-05 DIAGNOSIS — Z720 Tobacco use: Secondary | ICD-10-CM | POA: Diagnosis not present

## 2014-08-05 DIAGNOSIS — E785 Hyperlipidemia, unspecified: Secondary | ICD-10-CM | POA: Diagnosis not present

## 2014-08-05 DIAGNOSIS — E669 Obesity, unspecified: Secondary | ICD-10-CM | POA: Diagnosis not present

## 2014-08-05 DIAGNOSIS — F32A Depression, unspecified: Secondary | ICD-10-CM

## 2014-08-05 LAB — POCT GLYCOSYLATED HEMOGLOBIN (HGB A1C): Hemoglobin A1C: 5.4

## 2014-08-05 LAB — POCT UA - MICROALBUMIN: MICROALBUMIN (UR) POC: NEGATIVE mg/L

## 2014-08-05 MED ORDER — FLUOXETINE HCL 10 MG PO CAPS: 10.0000 mg | ORAL_CAPSULE | Freq: Every day | ORAL | Status: AC

## 2014-08-05 MED ORDER — FLUTICASONE PROPIONATE 50 MCG/ACT NA SUSP: 2.0000 | Freq: Every day | NASAL | Status: AC

## 2014-08-05 MED ORDER — OLMESARTAN MEDOXOMIL 40 MG PO TABS: 40.0000 mg | ORAL_TABLET | Freq: Every day | ORAL | Status: DC

## 2014-08-05 MED ORDER — ALLOPURINOL 300 MG PO TABS: 300.0000 mg | ORAL_TABLET | Freq: Every day | ORAL | Status: DC

## 2014-08-05 MED ORDER — AMLODIPINE BESYLATE 5 MG PO TABS: ORAL_TABLET | ORAL | Status: DC

## 2014-08-05 MED ORDER — METFORMIN HCL ER 500 MG PO TB24: ORAL_TABLET | ORAL | Status: AC

## 2014-08-05 NOTE — Patient Instructions (Signed)

## 2014-08-05 NOTE — Progress Notes (Signed)
Subjective:    Patient ID: Betty Lutz, female    DOB: 03-19-1965, 49 y.o.   MRN: [id number]  Pt presents to the office today for chronic follow up.  Pt states she "ran out" of all of her blood pressure and diabetic medications.  Diabetes She presents for her follow-up diabetic visit. She has type 2 diabetes mellitus. Her disease course has been worsening. Pertinent negatives for hypoglycemia include no confusion, headaches or nervousness/anxiousness. Pertinent negatives for diabetes include no blurred vision and no foot paresthesias. There are no hypoglycemic complications. Symptoms are worsening. There are no diabetic complications. Pertinent negatives for diabetic complications include no CVA. Risk factors for coronary artery disease include diabetes mellitus, dyslipidemia, hypertension, obesity, stress and tobacco exposure. Current diabetic treatment includes oral agent (monotherapy). She is compliant with treatment some of the time. An ACE inhibitor/angiotensin II receptor blocker is being taken. Eye exam is not current.  Hypertension This is a chronic problem. The current episode started more than 1 year ago. The problem has been waxing and waning since onset. The problem is uncontrolled. Associated symptoms include anxiety. Pertinent negatives include no blurred vision, headaches, palpitations, peripheral edema or shortness of breath. Risk factors for coronary artery disease include diabetes mellitus, dyslipidemia, obesity, post-menopausal state, smoking/tobacco exposure and stress. Past treatments include angiotensin blockers. There is no history of kidney disease, CAD/MI, CVA, heart failure or a thyroid problem. There is no history of sleep apnea.  Anxiety Presents for follow-up visit. The problem has been resolved. Patient reports no confusion, excessive worry, nervous/anxious behavior, palpitations or shortness of breath. Symptoms occur rarely. The severity of symptoms is mild.   Her past  medical history is significant for anxiety/panic attacks and depression. Past treatments include SSRIs. The treatment provided significant relief. Compliance with prior treatments has been good.  Gout PT currently taking allopurinol and states her last gout attack was "a long time ago". Pt is tolerating her meds well.     Review of Systems  Constitutional: Negative.   HENT: Negative.   Eyes: Negative.  Negative for blurred vision.  Respiratory: Negative.  Negative for shortness of breath.   Cardiovascular: Negative.  Negative for palpitations.  Gastrointestinal: Negative.   Endocrine: Negative.   Genitourinary: Negative.   Musculoskeletal: Negative.   Neurological: Negative.  Negative for headaches.  Hematological: Negative.   Psychiatric/Behavioral: Negative.  Negative for confusion. The patient is not nervous/anxious.   All other systems reviewed and are negative.      Objective:   Physical Exam  Constitutional: She is oriented to person, place, and time. She appears well-developed and well-nourished. No distress.  HENT:  Head: Normocephalic and atraumatic.  Right Ear: External ear normal.  Left Ear: External ear normal.  Nose: Nose normal.  Mouth/Throat: Oropharynx is clear and moist.  Eyes: Pupils are equal, round, and reactive to light.  Neck: Normal range of motion. Neck supple. No thyromegaly present.  Cardiovascular: Normal rate, regular rhythm, normal heart sounds and intact distal pulses.   No murmur heard. Pulmonary/Chest: Effort normal and breath sounds normal. No respiratory distress. She has no wheezes.  Abdominal: Soft. Bowel sounds are normal. She exhibits no distension. There is no tenderness.  Musculoskeletal: Normal range of motion. She exhibits no edema or tenderness.  Neurological: She is alert and oriented to person, place, and time. She has normal reflexes. No cranial nerve deficit.  Skin: Skin is warm and dry.  Psychiatric: She has a normal mood and  affect. Her behavior is  normal. Judgment and thought content normal.  Vitals reviewed.    BP 164/98 mmHg  Pulse 93  Temp(Src) 98.2 F (36.8 C) (Oral)  Ht '5\' 7"'  (1.702 m)  Wt 216 lb (97.977 kg)  BMI 33.82 kg/m2      Assessment & Plan:  1. Essential hypertension - CMP14+EGFR - amLODipine (NORVASC) 5 MG tablet; TAKE 1 TABLET EVERY DAY  Dispense: 90 tablet; Refill: 0 - olmesartan (BENICAR) 40 MG tablet; Take 1 tablet (40 mg total) by mouth daily.  Dispense: 90 tablet; Refill: 2  2. Rhinitis, chronic - CMP14+EGFR - fluticasone (FLONASE) 50 MCG/ACT nasal spray; Place 2 sprays into both nostrils daily.  Dispense: 16 g; Refill: 5  3. Type 2 diabetes mellitus with hyperglycemia - POCT glycosylated hemoglobin (Hb A1C) - POCT UA - Microalbumin - CMP14+EGFR - Ambulatory referral to Ophthalmology - metFORMIN (GLUCOPHAGE-XR) 500 MG 24 hr tablet; TAKE ONE TABLET BY MOUTH DAILY WITH BREAKFAST  Dispense: 90 tablet; Refill: 3  4. Dyslipidemia - CMP14+EGFR - Lipid panel  5. Obesity - CMP14+EGFR  6. Depression - CMP14+EGFR - FLUoxetine (PROZAC) 10 MG capsule; Take 1 capsule (10 mg total) by mouth daily.  Dispense: 90 capsule; Refill: 3  7. Tobacco user - CMP14+EGFR  8. Gout of foot, unspecified cause, unspecified chronicity, unspecified laterality - CMP14+EGFR - Uric acid - allopurinol (ZYLOPRIM) 300 MG tablet; Take 1 tablet (300 mg total) by mouth daily.  Dispense: 90 tablet; Refill: 3  9. Vitamin D deficiency - CMP14+EGFR - Vit D  25 hydroxy (rtn osteoporosis monitoring)  10. GAD (generalized anxiety disorder) - CMP14+EGFR - FLUoxetine (PROZAC) 10 MG capsule; Take 1 capsule (10 mg total) by mouth daily.  Dispense: 90 capsule; Refill: 3   Continue all meds Labs pending Health Maintenance reviewed Diet and exercise encouraged RTO 2 weeks   Evelina Dun, FNP

## 2014-08-06 LAB — LIPID PANEL
CHOL/HDL RATIO: 3.5 ratio (ref 0.0–4.4)
Cholesterol, Total: 149 mg/dL (ref 100–199)
HDL: 42 mg/dL (ref >39)
LDL Calculated: 75 mg/dL (ref 0–99)
Triglycerides: 160 mg/dL — ABNORMAL HIGH (ref 0–149)
VLDL Cholesterol Cal: 32 mg/dL (ref 5–40)

## 2014-08-06 LAB — CMP14+EGFR
ALT: 33 IU/L — AB (ref 0–32)
AST: 26 IU/L (ref 0–40)
Albumin/Globulin Ratio: 1.9 (ref 1.1–2.5)
Albumin: 4.5 g/dL (ref 3.5–5.5)
Alkaline Phosphatase: 69 IU/L (ref 39–117)
BUN/Creatinine Ratio: 15 (ref 9–23)
BUN: 13 mg/dL (ref 6–24)
Bilirubin Total: 0.9 mg/dL (ref 0.0–1.2)
CO2: 22 mmol/L (ref 18–29)
Calcium: 9.9 mg/dL (ref 8.7–10.2)
Chloride: 101 mmol/L (ref 97–108)
Creatinine, Ser: 0.87 mg/dL (ref 0.57–1.00)
GFR calc Af Amer: 92 mL/min/{1.73_m2} (ref >59)
GFR calc non Af Amer: 80 mL/min/{1.73_m2} (ref >59)
GLOBULIN, TOTAL: 2.4 g/dL (ref 1.5–4.5)
Glucose: 92 mg/dL (ref 65–99)
POTASSIUM: 4.1 mmol/L (ref 3.5–5.2)
Sodium: 141 mmol/L (ref 134–144)
Total Protein: 6.9 g/dL (ref 6.0–8.5)

## 2014-08-06 LAB — VITAMIN D 25 HYDROXY (VIT D DEFICIENCY, FRACTURES): Vit D, 25-Hydroxy: 7.8 ng/mL — ABNORMAL LOW (ref 30.0–100.0)

## 2014-08-06 LAB — URIC ACID: Uric Acid: 9.4 mg/dL — ABNORMAL HIGH (ref 2.5–7.1)

## 2014-08-08 ENCOUNTER — Other Ambulatory Visit: Payer: Self-pay | Admitting: Family

## 2014-08-09 ENCOUNTER — Other Ambulatory Visit: Payer: Self-pay | Admitting: Family

## 2014-08-09 ENCOUNTER — Telehealth: Payer: Self-pay | Admitting: *Deleted

## 2014-08-09 ENCOUNTER — Other Ambulatory Visit: Payer: Self-pay | Admitting: *Deleted

## 2014-08-09 MED ORDER — VITAMIN D (ERGOCALCIFEROL) 1.25 MG (50000 UNIT) PO CAPS: 50000.0000 [IU] | ORAL_CAPSULE | ORAL | Status: AC

## 2014-08-09 MED ORDER — ALLOPURINOL 100 MG PO TABS: 200.0000 mg | ORAL_TABLET | Freq: Two times a day (BID) | ORAL | Status: DC

## 2014-08-09 NOTE — Telephone Encounter (Signed)
-----   Message from Junie Spencer, FNP sent at 08/09/2014  9:40 AM EDT ----- HgbA1C WNL Microalbumin negative Kidney function stable Liver enzyme slightly elevated- Pt needs to limit tylenol and alcohol intake LDL levels WNL- Triglycerides elevated- Pt needs to be on low fat diet Vit D levels low- Prescription sent to pharmacy  Uric acid levels elevated- Allopurinol changed to 200 mg twice a day - New Prescription sent to pharmacy- Will continue to monitor

## 2014-08-10 ENCOUNTER — Telehealth: Payer: Self-pay

## 2014-08-10 MED ORDER — LOSARTAN POTASSIUM 100 MG PO TABS
100.0000 mg | ORAL_TABLET | Freq: Every day | ORAL | Status: AC
Start: 2014-08-10 — End: ?

## 2014-08-10 NOTE — Telephone Encounter (Signed)
Benicar non preferred with Medicaid  Must try and fail an ace inhibitor: Diovan or Losartan

## 2014-08-10 NOTE — Telephone Encounter (Signed)
lmtcb

## 2014-08-10 NOTE — Telephone Encounter (Signed)
Insurance denied Benicar rx. Losartan Prescription sent to pharmacy

## 2014-08-12 NOTE — Telephone Encounter (Signed)
Patient aware.

## 2014-08-19 ENCOUNTER — Ambulatory Visit: Payer: Medicaid Other | Admitting: Family

## 2014-10-05 ENCOUNTER — Telehealth: Payer: Self-pay | Admitting: Family

## 2014-10-05 NOTE — Telephone Encounter (Signed)
Stp and advised for pink eye there are no oral medications she can take and to continue using the eyedrops that she was given at the ER this morning. Pt voiced understanding.

## 2014-10-08 ENCOUNTER — Encounter: Payer: Self-pay | Admitting: Pediatrics

## 2014-10-08 ENCOUNTER — Ambulatory Visit (INDEPENDENT_AMBULATORY_CARE_PROVIDER_SITE_OTHER): Payer: Medicaid Other | Admitting: Pediatrics

## 2014-10-08 VITALS — Temp 97.6°F | Ht 67.0 in | Wt 221.4 lb

## 2014-10-08 DIAGNOSIS — H109 Unspecified conjunctivitis: Secondary | ICD-10-CM | POA: Diagnosis not present

## 2014-10-08 MED ORDER — POLYMYXIN B-TRIMETHOPRIM 10000-0.1 UNIT/ML-% OP SOLN: 2.0000 [drp] | Freq: Four times a day (QID) | OPHTHALMIC | Status: AC

## 2014-10-08 NOTE — Patient Instructions (Signed)
  of ibuprofen three times a day Can try neti pot twice a day

## 2014-10-08 NOTE — Progress Notes (Signed)
Subjective:    Patient ID: Betty Lutz, female    DOB: [birth year], 49 y.o.   MRN: [id number]  HPI: Betty Lutz is a 49 y.o. female presenting on 10/08/2014 for Conjunctivitis  Eyes are itching, runny, matted in the morning, eyes dont hurt. Eyes are sensitive to the light. Seen in ED 3 days ago, given eye drops, she thinks it has sulfa in it. 5 days ago woke up with congestion, eyes became more and more red. Coughing, no fevers, appetite normal now, was poor before. Feels sinus pressure, taking ibuprofen  two times a day. No sore throat No sick contacts. No N/V/D.  Relevant past medical, surgical, family and social history reviewed and updated as indicated. Interim medical history since our last visit reviewed. Allergies and medications reviewed and updated.  ROS: Per HPI unless specifically indicated above  Past Medical History Patient Active Problem List   Diagnosis Date Noted  . Vitamin D deficiency 08/05/2014  . Pain in lower limb 06/05/2013  . 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 Rhinitis, chronic 12/29/2012  . Hemoptysis, unspecified 10/06/2012  . Tobacco user 10/06/2012  . Cough 10/06/2012  . Mastodynia 10/06/2012  . DM type 2 (diabetes mellitus, type 2) (HCC) 05/28/2012  . Dyslipidemia 05/28/2012  . Obesity 05/28/2012  . Gout 05/28/2012  . Depression 05/28/2012  . Hypertension 05/28/2012    Current Outpatient Prescriptions  Medication Sig Dispense Refill  . allopurinol (ZYLOPRIM) 100 MG tablet Take 2 tablets (200 mg total) by mouth 2 (two) times daily. 120 tablet 3  . amLODipine (NORVASC) 5 MG tablet TAKE 1 TABLET EVERY DAY 90 tablet 0  . FLUoxetine (PROZAC) 10 MG capsule Take 1 capsule (10 mg total) by mouth daily. 90 capsule 3  . fluticasone (FLONASE) 50 MCG/ACT nasal spray Place 2 sprays into both nostrils daily. 16 g 5  . losartan (COZAAR) 100 MG tablet Take 1 tablet (100 mg total) by mouth daily. 90 tablet 3  . metFORMIN  (GLUCOPHAGE-XR) 500 MG 24 hr tablet TAKE ONE TABLET BY MOUTH DAILY WITH BREAKFAST 90 tablet 3  . Vitamin D, Ergocalciferol, (DRISDOL) 50000 UNITS CAPS capsule Take 1 capsule (50,000 Units total) by mouth every 7 (seven) days. 12 capsule 3  . trimethoprim-polymyxin b (POLYTRIM) ophthalmic solution Place 2 drops into both eyes 4 (four) times daily. 10 mL 0   No current facility-administered medications for this visit.       Objective:    Temp(Src) 97.6 F (36.4 C) (Oral)  Ht  (1.702 m)  Wt 221 lb 6.4 oz (100.426 kg)  BMI 34.67 kg/m2  Wt Readings from Last 3 Encounters:  10/08/14 221 lb 6.4 oz (100.426 kg)  08/05/14 216 lb (97.977 kg)  06/05/13 246 lb (111.585 kg)    Gen: NAD, alert, congested EYES: EOMI, no pain with ROM. conjunctiva injected b/l ENT:  Some fluid behind R TM. Normal pearly gray L TM, OP without erythema LYMPH: no cervical LAD CV: NRRR, normal S1/S2, no murmur, DP pulses 2+ b/l Resp: CTABL, no wheezes, normal WOB Abd: +BS, soft, NTND. no guarding or organomegaly Ext: No edema, warm Neuro: Alert and oriented    Assessment & Plan:   Betty Lutz was seen today for conjunctivitis, sinus pressure, cough. She was given a medicine three days ago for the conjunctivitis, she thinks sulfa was in it, isnt sure what it was. Has helped minimally. Will switch to polytrim, stop the other medicine. Continues to have crusting and discharge  from both eyes, R>L. Discussed symptomatic care for acute URI. Will call if not better on Monday. Seek medical attention for painful eyes.  Diagnoses and all orders for this visit:  Bilateral conjunctivitis -     trimethoprim-polymyxin b (POLYTRIM) ophthalmic solution; Place 2 drops into both eyes 4 (four) times daily.  Follow up plan: Return if symptoms worsen or fail to improve.  Rex Kras, MD [place] [facility] 10/08/2014, 9:00 AM

## 2014-10-14 ENCOUNTER — Encounter: Payer: Self-pay | Admitting: Pediatrics

## 2014-10-14 ENCOUNTER — Ambulatory Visit (INDEPENDENT_AMBULATORY_CARE_PROVIDER_SITE_OTHER): Payer: Medicaid Other | Admitting: Pediatrics

## 2014-10-14 VITALS — BP 134/86 | HR 87 | Temp 98.8°F | Ht 67.0 in | Wt 218.4 lb

## 2014-10-14 DIAGNOSIS — I10 Essential (primary) hypertension: Secondary | ICD-10-CM | POA: Diagnosis not present

## 2014-10-14 DIAGNOSIS — Z23 Encounter for immunization: Secondary | ICD-10-CM | POA: Diagnosis not present

## 2014-10-14 DIAGNOSIS — B309 Viral conjunctivitis, unspecified: Secondary | ICD-10-CM | POA: Diagnosis not present

## 2014-10-14 NOTE — Progress Notes (Signed)
Subjective:    Patient ID: Betty Lutz, female    DOB: 07/27/1965, 49 y.o.   MRN: [id number]  CC: f/u conjunctivitis  HPI: Betty Lutz is a 49 y.o. female presenting on 10/14/2014 for Follow-up  She says her eyes have felt better, not itchy and irritated as they were before. Feel almost back to normal though they do tear when she puts in the eye drops. She stopped using the eye drops 1-2 days after her last visit. Her URI symptoms are much better. No eye pain. Vision back to normal. Normal appetite.   Relevant past medical, surgical, family and social history reviewed and updated as indicated. Interim medical history since our last visit reviewed. Allergies and medications reviewed and updated.   ROS: Per HPI unless specifically indicated above  Past Medical History Patient Active Problem List   Diagnosis Date Noted  . Vitamin D deficiency 08/05/2014  . Pain in lower limb 06/05/2013  . 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 Rhinitis, chronic 12/29/2012  . Hemoptysis, unspecified 10/06/2012  . Tobacco user 10/06/2012  . Cough 10/06/2012  . Mastodynia 10/06/2012  . DM type 2 (diabetes mellitus, type 2) (HCC) 05/28/2012  . Dyslipidemia 05/28/2012  . Obesity 05/28/2012  . Gout 05/28/2012  . Depression 05/28/2012  . Hypertension 05/28/2012    Current Outpatient Prescriptions  Medication Sig Dispense Refill  . allopurinol (ZYLOPRIM) 100 MG tablet Take 2 tablets (200 mg total) by mouth 2 (two) times daily. 120 tablet 3  . amLODipine (NORVASC) 5 MG tablet TAKE 1 TABLET EVERY DAY 90 tablet 0  . FLUoxetine (PROZAC) 10 MG capsule Take 1 capsule (10 mg total) by mouth daily. 90 capsule 3  . fluticasone (FLONASE) 50 MCG/ACT nasal spray Place 2 sprays into both nostrils daily. 16 g 5  . losartan (COZAAR) 100 MG tablet Take 1 tablet (100 mg total) by mouth daily. 90 tablet 3  . metFORMIN (GLUCOPHAGE-XR) 500 MG 24 hr tablet TAKE ONE TABLET BY MOUTH DAILY WITH  BREAKFAST 90 tablet 3  . trimethoprim-polymyxin b (POLYTRIM) ophthalmic solution Place 2 drops into both eyes 4 (four) times daily. 10 mL 0  . Vitamin D, Ergocalciferol, (DRISDOL) 50000 UNITS CAPS capsule Take 1 capsule (50,000 Units total) by mouth every 7 (seven) days. 12 capsule 3   No current facility-administered medications for this visit.       Objective:    BP 134/86 mmHg  Pulse 87  Temp(Src) 98.8 F (37.1 C) (Oral)  Ht 5\' 7"  (1.702 m)  Wt 218 lb 6.4 oz (99.066 kg)  BMI 34.20 kg/m2  Wt Readings from Last 3 Encounters:  10/14/14 218 lb 6.4 oz (99.066 kg)  10/08/14 221 lb 6.4 oz (100.426 kg)  08/05/14 216 lb (97.977 kg)     Gen: NAD, alert, cooperative with exam, NCAT EYES: no pain with EOMI, slight conjunctival injection, no scleral injection or icterus ENT:  TMs pearly gray b/l, OP without erythema LYMPH: no cervical LAD CV: NRRR, normal S1/S2, no murmur, distal pulses 2+ b/l Resp: CTABL, no wheezes, normal WOB Abd: +BS, soft, NTND. no guarding or organomegaly Ext: No edema, warm Neuro: Alert and oriented     Assessment & Plan:   Betty Lutz was seen today for follow-up.  Diagnoses and all orders for this visit:  Viral conjunctivitis Improving symptoms and exam. OK to stop eye drops.   Essential hypertension Well controlled today. Continue current meds.  Other orders -     Flu  Vaccine QUAD 36+ mos IM  Follow up plan: Return for Feb 2016 for blood pressure check.  Rex Kras, MD [facility] 10/14/2014, 12:55 PM

## 2014-12-01 ENCOUNTER — Ambulatory Visit: Payer: Medicaid Other | Admitting: Pediatrics

## 2015-01-06 ENCOUNTER — Other Ambulatory Visit: Payer: Self-pay | Admitting: *Deleted

## 2015-01-06 DIAGNOSIS — I10 Essential (primary) hypertension: Secondary | ICD-10-CM

## 2015-01-06 MED ORDER — AMLODIPINE BESYLATE 5 MG PO TABS: ORAL_TABLET | ORAL | Status: AC

## 2015-02-10 ENCOUNTER — Ambulatory Visit: Payer: Medicaid Other | Admitting: Pediatrics

## 2015-02-16 ENCOUNTER — Ambulatory Visit: Payer: Medicaid Other | Admitting: Pediatrics

## 2015-02-25 ENCOUNTER — Encounter: Payer: Self-pay | Admitting: Pediatrics

## 2015-07-12 IMAGING — US US BREAST*L*
1 series · 2 of 2 positions shown · non-contrast
Comparison: 04/30/2008

CLINICAL DATA: History of left breast pain. Patient is not having
pain today.

EXAM:
DIGITAL DIAGNOSTIC  BILATERAL MAMMOGRAM WITH CAD
ULTRASOUND LEFT BREAST

[Series 1: us breast*left* · 0.04mm/px · 2 of 2 slices shown]
[im 1/2]
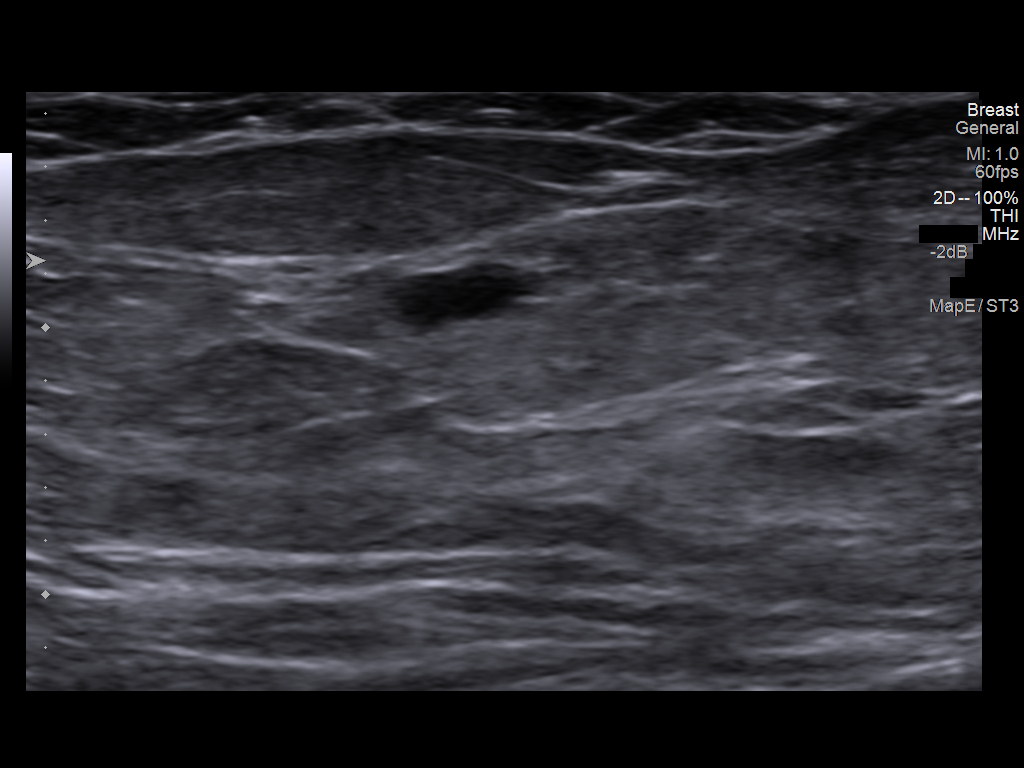
[im 2/2]
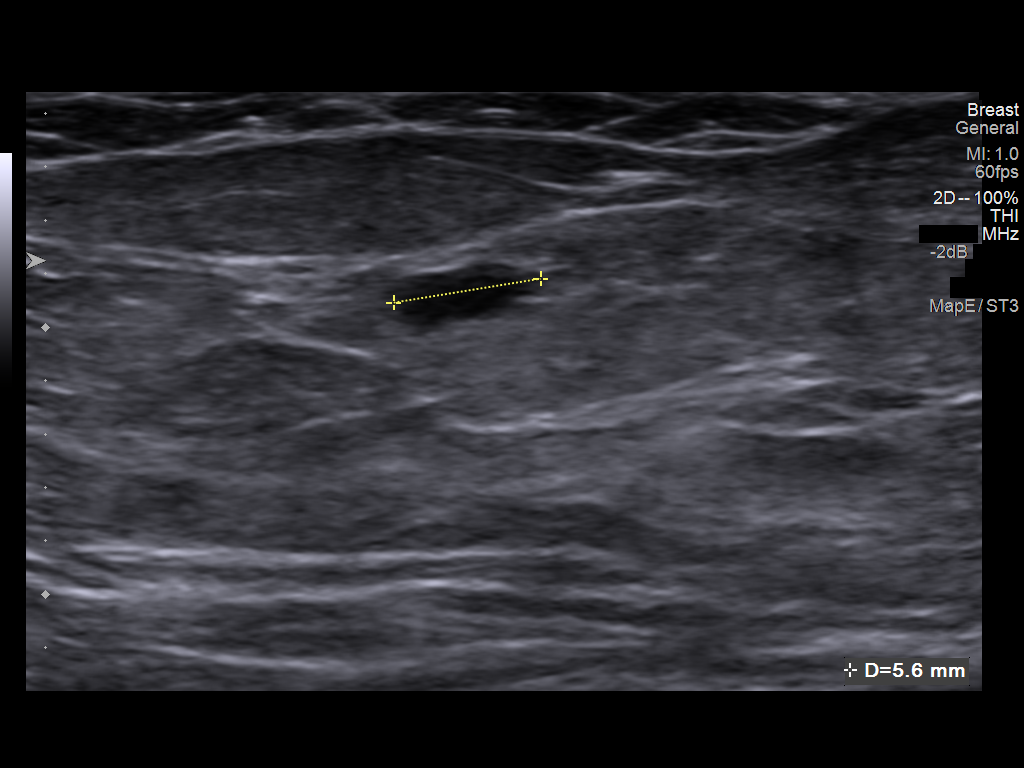

[2 of 2 positions shown; findings below may reference images not displayed]

ACR Breast Density Category b: There are scattered areas of
fibroglandular density.
FINDINGS: An 8 mm nodular density is identified in the slightly medial upper
left breast. No suspicious microcalcifications or architectural
distortion.

Mammographic images were processed with CAD.

On physical exam,no palpable abnormalities are identified in the
upper inner left breast.

Targeted ultrasound demonstrates a hypoechoic circumscribed oval
mass at 9 o'clock, 5 cm from the nipple. This measures 6 x 3 x 2 mm.
This likely represents a complicated cyst.
IMPRESSION: Probably benign findings.

RECOMMENDATION:
Six-month follow-up left diagnostic mammogram and ultrasound.

I have discussed the findings and recommendations with the patient.
Results were also provided in writing at the conclusion of the
visit.

BI-RADS CATEGORY  3: Probably benign finding(s) - short interval
follow-up suggested.

## 2017-07-10 DIAGNOSIS — Z1211 Encounter for screening for malignant neoplasm of colon: Secondary | ICD-10-CM | POA: Insufficient documentation

## 2018-03-11 ENCOUNTER — Encounter: Payer: Self-pay | Admitting: Orthopaedic Surgery

## 2018-03-19 ENCOUNTER — Ambulatory Visit: Payer: Self-pay | Admitting: Orthopaedic Surgery

## 2018-03-25 ENCOUNTER — Other Ambulatory Visit: Payer: Self-pay

## 2018-03-25 ENCOUNTER — Ambulatory Visit (INDEPENDENT_AMBULATORY_CARE_PROVIDER_SITE_OTHER): Payer: Medicaid Other

## 2018-03-25 ENCOUNTER — Encounter: Payer: Self-pay | Admitting: Orthopaedic Surgery

## 2018-03-25 ENCOUNTER — Ambulatory Visit: Payer: Medicaid Other | Admitting: Orthopaedic Surgery

## 2018-03-25 VITALS — BP 176/99 | HR 122 | Temp 97.2°F | Ht 67.0 in | Wt 248.0 lb

## 2018-03-25 DIAGNOSIS — M25511 Pain in right shoulder: Secondary | ICD-10-CM

## 2018-03-25 MED ORDER — HYDROCODONE-ACETAMINOPHEN 5-325 MG PO TABS: ORAL_TABLET | ORAL | 0 refills | Status: DC

## 2018-03-25 MED ORDER — NAPROXEN 500 MG PO TABS: 500.0000 mg | ORAL_TABLET | Freq: Two times a day (BID) | ORAL | 5 refills | Status: AC

## 2018-03-25 NOTE — Progress Notes (Signed)
Subjective:    Patient ID: Betty Lutz, female    DOB: [birth year], 53 y.o.   MRN: [id number]  HPI She has been having shoulder pain on the right for several weeks. She has no trauma. She has seen Dr. Lysbeth Galas for this and I have copies of his notes. She has some paresthesias at times and some hand pain but no neck pain. She has no swelling, no redness.  She has tried rest, heat, ice, rubs and Advil with little help.   Review of Systems  Constitutional: Positive for activity change.  Musculoskeletal: Positive for arthralgias.  All other systems reviewed and are negative.  For Review of Systems, all other systems reviewed and are negative.  The following is a summary of the past history medically, past history surgically, known current medicines, social history and family history.  This information is gathered electronically by the computer from prior information and documentation.  I review this each visit and have found including this information at this point in the chart is beneficial and informative.   Past Medical History:  Diagnosis Date  . Depression   . Diabetes mellitus without complication (HCC)   . Gout   . High cholesterol   . Hypertension     Past Surgical History:  Procedure Laterality Date  . ABDOMINAL HYSTERECTOMY    . CHOLECYSTECTOMY    . TONSILLECTOMY      Current Outpatient Medications on File Prior to Visit  Medication Sig Dispense Refill  . allopurinol (ZYLOPRIM) 100 MG tablet Take 2 tablets (200 mg total) by mouth 2 (two) times daily. 120 tablet 3  . amLODipine (NORVASC) 5 MG tablet TAKE 1 TABLET EVERY DAY 90 tablet 0  . FLUoxetine (PROZAC) 10 MG capsule Take 1 capsule (10 mg total) by mouth daily. 90 capsule 3  . fluticasone (FLONASE) 50 MCG/ACT nasal spray Place 2 sprays into both nostrils daily. 16 g 5  . losartan (COZAAR) 100 MG tablet Take 1 tablet (100 mg total) by mouth daily. 90 tablet 3  . metFORMIN (GLUCOPHAGE-XR) 500 MG 24 hr tablet TAKE ONE  TABLET BY MOUTH DAILY WITH BREAKFAST 90 tablet 3  . trimethoprim-polymyxin b (POLYTRIM) ophthalmic solution Place 2 drops into both eyes 4 (four) times daily. 10 mL 0  . Vitamin D, Ergocalciferol, (DRISDOL) 50000 UNITS CAPS capsule Take 1 capsule (50,000 Units total) by mouth every 7 (seven) days. 12 capsule 3   No current facility-administered medications on file prior to visit.     Social History   Socioeconomic History  . Marital status: Single    Spouse name: Not on file  . Number of children: 1  . Years of education: Not on file  . Highest education level: Not on file  Occupational History  . Occupation: unemployed  Social Needs  . Financial resource strain: Not on file  . Food insecurity:    Worry: Not on file    Inability: Not on file  . Transportation needs:    Medical: Not on file    Non-medical: Not on file  Tobacco Use  . Smoking status: Current Every Day Smoker    Packs/day: 0.25    Types: Cigarettes  . Smokeless tobacco: Never Used  Substance and Sexual Activity  . Alcohol use: No    Comment: perviously used 1 can of beer daily  . Drug use: No  . Sexual activity: Not on file  Lifestyle  . Physical activity:    Days per week: Not on file  Minutes per session: Not on file  . Stress: Not on file  Relationships  . Social connections:    Talks on phone: Not on file    Gets together: Not on file    Attends religious service: Not on file    Active member of club or organization: Not on file    Attends meetings of clubs or organizations: Not on file    Relationship status: Not on file  . Intimate partner violence:    Fear of current or ex partner: Not on file    Emotionally abused: Not on file    Physically abused: Not on file    Forced sexual activity: Not on file  Other Topics Concern  . Not on file  Social History Narrative  . Not on file    Family History  Problem Relation Age of Onset  . Cancer Father        prostate    BP (!) 176/99    Pulse (!) 122   Temp (!) 97.2 F (36.2 C)   Ht 5\' 7"  (1.702 m)   Wt 248 lb (112.5 kg)   BMI 38.84 kg/m   Body mass index is 38.84 kg/m.     Objective:   Physical Exam Vitals signs reviewed.  Constitutional:      Appearance: She is well-developed.  HENT:     Head: Normocephalic and atraumatic.  Eyes:     Conjunctiva/sclera: Conjunctivae normal.     Pupils: Pupils are equal, round, and reactive to light.  Neck:     Musculoskeletal: Normal range of motion and neck supple.  Cardiovascular:     Rate and Rhythm: Normal rate and regular rhythm.  Pulmonary:     Effort: Pulmonary effort is normal.  Abdominal:     Palpations: Abdomen is soft.  Musculoskeletal:     Right shoulder: She exhibits tenderness.       Arms:  Skin:    General: Skin is warm and dry.  Neurological:     Mental Status: She is alert and oriented to person, place, and time.     Cranial Nerves: No cranial nerve deficit.     Motor: No abnormal muscle tone.     Coordination: Coordination normal.     Deep Tendon Reflexes: Reflexes are normal and symmetric. Reflexes normal.  Psychiatric:        Behavior: Behavior normal.        Thought Content: Thought content normal.        Judgment: Judgment normal.     X-rays were done of the right shoulder, reported separately.      Assessment & Plan:   Encounter Diagnosis  Name Primary?  . Acute pain of right shoulder Yes   PROCEDURE NOTE:  The patient request injection, verbal consent was obtained.  The right shoulder was prepped appropriately after time out was performed.   Sterile technique was observed and injection of 1 cc of Depo-Medrol 40 mg with several cc's of plain xylocaine. Anesthesia was provided by ethyl chloride and a 20-gauge needle was used to inject the shoulder area. A posterior approach was used.  The injection was tolerated well.  A band aid dressing was applied.  The patient was advised to apply ice later today and tomorrow to the  injection sight as needed.  I have given Rx for Naprosyn 500 po bid pc.  Return in one month.  Call if any problem.  Precautions discussed.   Electronically Signed Darreld Mclean, MD 3/24/20209:15 AM

## 2018-04-09 ENCOUNTER — Telehealth: Payer: Self-pay | Admitting: Orthopaedic Surgery

## 2018-04-09 MED ORDER — HYDROCODONE-ACETAMINOPHEN 5-325 MG PO TABS: ORAL_TABLET | ORAL | 0 refills | Status: DC

## 2018-04-09 NOTE — Telephone Encounter (Signed)
Patient requests refill on Hydrocodone/Acetaminophen 5-325  Mgs.   Qty  30   Sig: One tablet every four hours as needed for acute pain. Limit of five days per [place].   Patient states she uses [facility] in [place]

## 2018-04-22 ENCOUNTER — Ambulatory Visit: Payer: Medicaid Other | Admitting: Orthopaedic Surgery

## 2018-04-22 ENCOUNTER — Encounter: Payer: Self-pay | Admitting: Orthopaedic Surgery

## 2018-04-22 ENCOUNTER — Other Ambulatory Visit: Payer: Self-pay

## 2018-04-22 VITALS — BP 183/113 | HR 117 | Temp 96.7°F | Ht 67.0 in | Wt 254.0 lb

## 2018-04-22 DIAGNOSIS — M25511 Pain in right shoulder: Secondary | ICD-10-CM

## 2018-04-22 DIAGNOSIS — G8929 Other chronic pain: Secondary | ICD-10-CM

## 2018-04-22 MED ORDER — HYDROCODONE-ACETAMINOPHEN 5-325 MG PO TABS: ORAL_TABLET | ORAL | 0 refills | Status: DC

## 2018-04-22 NOTE — Progress Notes (Signed)
Patient Betty Lutz, female DOB:02/15/1965, 53 y.o. BZJ:[id number]  Chief Complaint  Patient presents with  . Shoulder Pain    HPI  Betty Lutz is a 53 y.o. female who has chronic pain of the right shoulder. She has no new trauma. The injection last time helped some but she still has pain with rainy weather and lifting her hand above her head.  She has no swelling.   Body mass index is 39.78 kg/m.  ROS  Review of Systems  Constitutional: Positive for activity change.  Musculoskeletal: Positive for arthralgias.  All other systems reviewed and are negative.   All other systems reviewed and are negative.  The following is a summary of the past history medically, past history surgically, known current medicines, social history and family history.  This information is gathered electronically by the computer from prior information and documentation.  I review this each visit and have found including this information at this point in the chart is beneficial and informative.    Past Medical History:  Diagnosis Date  . Depression   . Diabetes mellitus without complication (HCC)   . Gout   . High cholesterol   . Hypertension     Past Surgical History:  Procedure Laterality Date  . ABDOMINAL HYSTERECTOMY    . CHOLECYSTECTOMY    . TONSILLECTOMY      Family History  Problem Relation Age of Onset  . Cancer Father        prostate    Social History Social History   Tobacco Use  . Smoking status: Current Every Day Smoker    Packs/day: 0.25    Types: Cigarettes  . Smokeless tobacco: Never Used  Substance Use Topics  . Alcohol use: No    Comment: perviously used 1 can of beer daily  . Drug use: No    No Known Allergies  Current Outpatient Medications  Medication Sig Dispense Refill  . allopurinol (ZYLOPRIM) 100 MG tablet Take 2 tablets (200 mg total) by mouth 2 (two) times daily. 120 tablet 3  . amLODipine (NORVASC) 5 MG tablet TAKE 1 TABLET EVERY DAY 90  tablet 0  . FLUoxetine (PROZAC) 10 MG capsule Take 1 capsule (10 mg total) by mouth daily. 90 capsule 3  . fluticasone (FLONASE) 50 MCG/ACT nasal spray Place 2 sprays into both nostrils daily. 16 g 5  . HYDROcodone-acetaminophen (NORCO/VICODIN) 5-325 MG tablet One tablet every four hours as needed for acute pain.  Limit of five days per [place] statue. 30 tablet 0  . losartan (COZAAR) 100 MG tablet Take 1 tablet (100 mg total) by mouth daily. 90 tablet 3  . metFORMIN (GLUCOPHAGE-XR) 500 MG 24 hr tablet TAKE ONE TABLET BY MOUTH DAILY WITH BREAKFAST 90 tablet 3  . naproxen (NAPROSYN) 500 MG tablet Take 1 tablet (500 mg total) by mouth 2 (two) times daily with a meal. 60 tablet 5  . trimethoprim-polymyxin b (POLYTRIM) ophthalmic solution Place 2 drops into both eyes 4 (four) times daily. 10 mL 0  . Vitamin D, Ergocalciferol, (DRISDOL) 50000 UNITS CAPS capsule Take 1 capsule (50,000 Units total) by mouth every 7 (seven) days. 12 capsule 3   No current facility-administered medications for this visit.      Physical Exam  Blood pressure (!) 183/113, pulse (!) 117, height 5\' 7"  (1.702 m), weight 254 lb (115.2 kg).  Constitutional: overall normal hygiene, normal nutrition, well developed, normal grooming, normal body habitus. Assistive device:none  Musculoskeletal: gait and station Limp none,  muscle tone and strength are normal, no tremors or atrophy is present.  .  Neurological: coordination overall normal.  Deep tendon reflex/nerve stretch intact.  Sensation normal.  Cranial nerves II-XII intact.   Skin:   Normal overall no scars, lesions, ulcers or rashes. No psoriasis.  Psychiatric: Alert and oriented x 3.  Recent memory intact, remote memory unclear.  Normal mood and affect. Well groomed.  Good eye contact.  Cardiovascular: overall no swelling, no varicosities, no edema bilaterally, normal temperatures of the legs and arms, no clubbing, cyanosis and good capillary refill.  Lymphatic:  palpation is normal.  Right shoulder has near full ROM with pain in the extremes.  NV intact.  All other systems reviewed and are negative   The patient has been educated about the nature of the problem(s) and counseled on treatment options.  The patient appeared to understand what I have discussed and is in agreement with it.  Encounter Diagnosis  Name Primary?  . Chronic pain in right shoulder Yes    PLAN Call if any problems.  Precautions discussed.  Continue current medications.   Return to clinic 6 weeks   I have reviewed the [facility] [place] web site prior to prescribing narcotic medicine for this patient.   Electronically Signed Darreld McleanWayne Jayvyn Haselton, MD 4/21/20208:37 AM

## 2018-04-30 ENCOUNTER — Telehealth: Payer: Self-pay | Admitting: Orthopaedic Surgery

## 2018-04-30 MED ORDER — HYDROCODONE-ACETAMINOPHEN 5-325 MG PO TABS: ORAL_TABLET | ORAL | 0 refills | Status: DC

## 2018-04-30 NOTE — Telephone Encounter (Signed)
Patient requests Hydrocodone/Acetaminophen 5-325  Mgs.   Qty  30  Sig: One tablet every four hours as needed for acute pain. Limit of five days per [place] statue.  Patient states she uses [facility] in [place]

## 2018-05-08 ENCOUNTER — Telehealth: Payer: Self-pay | Admitting: Orthopaedic Surgery

## 2018-05-08 MED ORDER — HYDROCODONE-ACETAMINOPHEN 5-325 MG PO TABS: ORAL_TABLET | ORAL | 0 refills | Status: DC

## 2018-05-08 NOTE — Telephone Encounter (Signed)
Patient requests refill on Hydrocodone/Acetaminophen 5-325  Mgs.   Qty 25  Sig: One tablet every six hours as needed for acute pain.  Patient states she uses [facility] in [place]

## 2018-05-19 ENCOUNTER — Telehealth: Payer: Self-pay | Admitting: Orthopaedic Surgery

## 2018-05-19 MED ORDER — HYDROCODONE-ACETAMINOPHEN 5-325 MG PO TABS: ORAL_TABLET | ORAL | 0 refills | Status: DC

## 2018-05-19 NOTE — Telephone Encounter (Signed)
Patient called for refill:  HYDROcodone-acetaminophen (NORCO/VICODIN) 5-325 MG tablet 25 tablet 0 05/08/2018    Sig: One tablet every six hours as needed for acute pain.    - [facility], [facility]   - patient aware of appointment scheduled 06/03/18

## 2018-06-03 ENCOUNTER — Ambulatory Visit: Payer: Self-pay | Admitting: Orthopaedic Surgery

## 2018-06-10 ENCOUNTER — Encounter: Payer: Self-pay | Admitting: Orthopaedic Surgery

## 2018-06-10 ENCOUNTER — Ambulatory Visit: Payer: Medicaid Other | Admitting: Orthopaedic Surgery

## 2018-06-10 ENCOUNTER — Other Ambulatory Visit: Payer: Self-pay

## 2018-06-10 VITALS — BP 170/100 | HR 103 | Temp 97.0°F | Ht 67.0 in | Wt 263.0 lb

## 2018-06-10 DIAGNOSIS — G8929 Other chronic pain: Secondary | ICD-10-CM | POA: Diagnosis not present

## 2018-06-10 DIAGNOSIS — M25511 Pain in right shoulder: Secondary | ICD-10-CM

## 2018-06-10 MED ORDER — HYDROCODONE-ACETAMINOPHEN 5-325 MG PO TABS: ORAL_TABLET | ORAL | 0 refills | Status: DC

## 2018-06-10 NOTE — Progress Notes (Signed)
PROCEDURE NOTE:  The patient request injection, verbal consent was obtained.  The right shoulder was prepped appropriately after time out was performed.   Sterile technique was observed and injection of 1 cc of Depo-Medrol 40 mg with several cc's of plain xylocaine. Anesthesia was provided by ethyl chloride and a 20-gauge needle was used to inject the shoulder area. A posterior approach was used.  The injection was tolerated well.  A band aid dressing was applied.  The patient was advised to apply ice later today and tomorrow to the injection sight as needed.  Return in six weeks.  I have reviewed the [place] web site prior to prescribing narcotic medicine for this patient.   Electronically Signed Sanjuana Kava, MD 6/9/20208:23 AM

## 2018-06-19 ENCOUNTER — Telehealth: Payer: Self-pay | Admitting: Orthopaedic Surgery

## 2018-06-19 MED ORDER — HYDROCODONE-ACETAMINOPHEN 5-325 MG PO TABS: ORAL_TABLET | ORAL | 0 refills | Status: DC

## 2018-06-19 NOTE — Telephone Encounter (Signed)
Patient requests refill on Hydrocodone/Acetaminophen 5-325  Mgs.   Qty  25       Sig: One tablet every six hours as needed for acute pain.     Patient states she uses [facility]

## 2018-07-01 ENCOUNTER — Telehealth: Payer: Self-pay | Admitting: Orthopaedic Surgery

## 2018-07-01 MED ORDER — HYDROCODONE-ACETAMINOPHEN 5-325 MG PO TABS: ORAL_TABLET | ORAL | 0 refills | Status: DC

## 2018-07-01 NOTE — Telephone Encounter (Signed)
Patient called for refill: HYDROcodone-acetaminophen (NORCO/VICODIN) 5-325 MG tablet 20 tablet   - [facility], [facility]

## 2018-07-10 ENCOUNTER — Telehealth: Payer: Self-pay | Admitting: Orthopaedic Surgery

## 2018-07-10 MED ORDER — HYDROCODONE-ACETAMINOPHEN 5-325 MG PO TABS: ORAL_TABLET | ORAL | 0 refills | Status: DC

## 2018-07-10 NOTE — Telephone Encounter (Signed)
Patient requests refill on Hydrocodone/Acetaminophen 5-325  Mgs.  Qty 20  Sig: One tablet every six hours as needed for acute pain.          Patient states she uses [facility] in [place]

## 2018-07-23 ENCOUNTER — Other Ambulatory Visit: Payer: Self-pay

## 2018-07-23 ENCOUNTER — Ambulatory Visit (INDEPENDENT_AMBULATORY_CARE_PROVIDER_SITE_OTHER): Payer: Medicaid Other

## 2018-07-23 ENCOUNTER — Encounter: Payer: Self-pay | Admitting: Orthopaedic Surgery

## 2018-07-23 ENCOUNTER — Ambulatory Visit (INDEPENDENT_AMBULATORY_CARE_PROVIDER_SITE_OTHER): Payer: Medicaid Other | Admitting: Orthopaedic Surgery

## 2018-07-23 VITALS — BP 130/88 | HR 124 | Ht 67.0 in | Wt 250.0 lb

## 2018-07-23 DIAGNOSIS — G8929 Other chronic pain: Secondary | ICD-10-CM

## 2018-07-23 DIAGNOSIS — M25511 Pain in right shoulder: Secondary | ICD-10-CM

## 2018-07-23 MED ORDER — HYDROCODONE-ACETAMINOPHEN 5-325 MG PO TABS: ORAL_TABLET | ORAL | 0 refills | Status: DC

## 2018-07-23 NOTE — Patient Instructions (Signed)

## 2018-07-23 NOTE — Addendum Note (Signed)
Addended by: Derek Mound A on: 07/23/2018 04:22 PM   Modules accepted: Orders

## 2018-07-23 NOTE — Progress Notes (Signed)
Patient UR:[facility] Betty Lutz, female DOB:06-11-1965, 53 y.o. BJS:[id number]  Chief Complaint  Patient presents with  . Shoulder Pain    R/Shot helped/pain level is a 9    HPI  Betty Lutz is a 53 y.o. female who is having more and more pain from the right shoulder.  She has pain with overhead use and in rolling on it at night.  She has pain during the day.  She denies any new trauma.  She has no numbness. She has no redness or swelling.   Body mass index is 39.16 kg/m.  ROS  Review of Systems  Constitutional: Positive for activity change.  Musculoskeletal: Positive for arthralgias.  All other systems reviewed and are negative.   All other systems reviewed and are negative.  The following is a summary of the past history medically, past history surgically, known current medicines, social history and family history.  This information is gathered electronically by the computer from prior information and documentation.  I review this each visit and have found including this information at this point in the chart is beneficial and informative.    Past Medical History:  Diagnosis Date  . Depression   . Diabetes mellitus without complication ([place])   . Gout   . High cholesterol   . Hypertension     Past Surgical History:  Procedure Laterality Date  . ABDOMINAL HYSTERECTOMY    . CHOLECYSTECTOMY    . TONSILLECTOMY      Family History  Problem Relation Age of Onset  . Cancer Father        prostate    Social History Social History   Tobacco Use  . Smoking status: Current Every Day Smoker    Packs/day: 0.25    Types: Cigarettes  . Smokeless tobacco: Never Used  Substance Use Topics  . Alcohol use: No    Comment: perviously used 1 can of beer daily  . Drug use: No    No Known Allergies  Current Outpatient Medications  Medication Sig Dispense Refill  . allopurinol (ZYLOPRIM) 100 MG tablet Take 2 tablets (200 mg total) by mouth 2 (two) times daily. 120 tablet 3  .  amLODipine (NORVASC) 5 MG tablet TAKE 1 TABLET EVERY DAY 90 tablet 0  . FLUoxetine (PROZAC) 10 MG capsule Take 1 capsule (10 mg total) by mouth daily. 90 capsule 3  . fluticasone (FLONASE) 50 MCG/ACT nasal spray Place 2 sprays into both nostrils daily. 16 g 5  . HYDROcodone-acetaminophen (NORCO/VICODIN) 5-325 MG tablet One tablet every six hours as needed for acute pain. 25 tablet 0  . losartan (COZAAR) 100 MG tablet Take 1 tablet (100 mg total) by mouth daily. 90 tablet 3  . metFORMIN (GLUCOPHAGE-XR) 500 MG 24 hr tablet TAKE ONE TABLET BY MOUTH DAILY WITH BREAKFAST 90 tablet 3  . naproxen (NAPROSYN) 500 MG tablet Take 1 tablet (500 mg total) by mouth 2 (two) times daily with a meal. 60 tablet 5  . trimethoprim-polymyxin b (POLYTRIM) ophthalmic solution Place 2 drops into both eyes 4 (four) times daily. 10 mL 0  . Vitamin D, Ergocalciferol, (DRISDOL) 50000 UNITS CAPS capsule Take 1 capsule (50,000 Units total) by mouth every 7 (seven) days. 12 capsule 3   No current facility-administered medications for this visit.      Physical Exam  Blood pressure 130/88, pulse (!) 124, height 5\' 7"  (1.702 m), weight 250 lb (113.4 kg).  Constitutional: overall normal hygiene, normal nutrition, well developed, normal grooming, normal body habitus.  Assistive device:none  Musculoskeletal: gait and station Limp none, muscle tone and strength are normal, no tremors or atrophy is present.  .  Neurological: coordination overall normal.  Deep tendon reflex/nerve stretch intact.  Sensation normal.  Cranial nerves II-XII intact.   Skin:   Normal overall no scars, lesions, ulcers or rashes. No psoriasis.  Psychiatric: Alert and oriented x 3.  Recent memory intact, remote memory unclear.  Normal mood and affect. Well groomed.  Good eye contact.  Cardiovascular: overall no swelling, no varicosities, no edema bilaterally, normal temperatures of the legs and arms, no clubbing, cyanosis and good capillary  refill.  Lymphatic: palpation is normal.  Examination of right Upper Extremity is done.  Inspection:   Overall:  Elbow non-tender without crepitus or defects, forearm non-tender without crepitus or defects, wrist non-tender without crepitus or defects, hand non-tender.    Shoulder: with glenohumeral joint tenderness, without effusion.   Upper arm:  without swelling and tenderness   Range of motion:   Overall:  Full range of motion of the elbow, full range of motion of wrist and full range of motion in fingers.   Shoulder:  right  160 degrees forward flexion; 145 degrees abduction; 30 degrees internal rotation, 30 degrees external rotation, 15 degrees extension, 40 degrees adduction.   Stability:   Overall:  Shoulder, elbow and wrist stable   Strength and Tone:   Overall full shoulder muscles strength, full upper arm strength and normal upper arm bulk and tone.  All other systems reviewed and are negative   The patient has been educated about the nature of the problem(s) and counseled on treatment options.  The patient appeared to understand what I have discussed and is in agreement with it.  Encounter Diagnosis  Name Primary?  . Chronic pain in right shoulder Yes  x-rays were done of the right shoulder, reported separately. Negative.  PLAN Call if any problems.  Precautions discussed.  Continue current medications.   Return to clinic MRI of the right shoulder.  I am concerned about rotator cuff tear.  PROCEDURE NOTE:  The patient request injection, verbal consent was obtained.  The right shoulder was prepped appropriately after time out was performed.   Sterile technique was observed and injection of 1 cc of Depo-Medrol 40 mg with several cc's of plain xylocaine. Anesthesia was provided by ethyl chloride and a 20-gauge needle was used to inject the shoulder area. A posterior approach was used.  The injection was tolerated well.  A band aid dressing was applied.  The  patient was advised to apply ice later today and tomorrow to the injection sight as needed.    I have reviewed the [place] Controlled Substance Reporting System web site prior to prescribing narcotic medicine for this patient.  Electronically Signed Darreld McleanWayne Sumner Kirchman, MD 7/22/20209:24 AM

## 2018-07-30 ENCOUNTER — Ambulatory Visit (HOSPITAL_COMMUNITY): Payer: Medicaid Other

## 2018-07-31 ENCOUNTER — Telehealth: Payer: Self-pay

## 2018-07-31 NOTE — Telephone Encounter (Signed)
I just did it last week.  She has Medicaid. She needs to get narcotics from primary care.

## 2018-07-31 NOTE — Telephone Encounter (Signed)
Hydrocodone-Acetaminophen  5/325mg   Qty 25 Tablets  PATIENT USES [facility]

## 2018-08-05 ENCOUNTER — Ambulatory Visit (HOSPITAL_COMMUNITY): Payer: Medicaid Other

## 2018-08-06 ENCOUNTER — Ambulatory Visit: Payer: Medicaid Other | Admitting: Orthopaedic Surgery

## 2018-08-13 ENCOUNTER — Other Ambulatory Visit: Payer: Self-pay

## 2018-08-13 ENCOUNTER — Telehealth: Payer: Self-pay | Admitting: Orthopaedic Surgery

## 2018-08-13 ENCOUNTER — Ambulatory Visit (HOSPITAL_COMMUNITY)
Admission: RE | Admit: 2018-08-13 | Discharge: 2018-08-13 | Disposition: A | Discharge status: Home / Self Care | Payer: Medicaid Other | Source: Ambulatory Visit | Attending: Orthopaedic Surgery | Admitting: Orthopaedic Surgery

## 2018-08-13 DIAGNOSIS — G8929 Other chronic pain: Secondary | ICD-10-CM

## 2018-08-13 NOTE — Telephone Encounter (Signed)
Put in order, again  Will change auth to [place]

## 2018-08-13 NOTE — Telephone Encounter (Signed)
I called [facility] and changed facility on auth, I updated old MRI referral and canceled the most recent one entered.

## 2018-08-13 NOTE — Telephone Encounter (Signed)
Call received from patient; relays that she went to the scheduled MRI appointment at [facility] today, 08/13/18; states was unable to complete MRI there. States was told she can have it in [place], as they have a wider board there.  Please advise.

## 2018-08-13 NOTE — Telephone Encounter (Signed)
Will you help me with this?

## 2018-08-13 NOTE — Telephone Encounter (Signed)
Thank you thank you !!!

## 2018-08-20 ENCOUNTER — Ambulatory Visit: Payer: Medicaid Other | Admitting: Orthopaedic Surgery

## 2018-08-26 ENCOUNTER — Telehealth: Payer: Self-pay | Admitting: Radiology

## 2018-08-26 NOTE — Telephone Encounter (Signed)
I have called patient to give her the number to [facility] to schedule the MRI scan but unable to reach, as I was pulling up letter to send to her, she called back, Angie has provided her the phone number to call

## 2018-09-03 ENCOUNTER — Telehealth: Payer: Self-pay | Admitting: Orthopaedic Surgery

## 2018-09-03 MED ORDER — DIAZEPAM 10 MG PO TABS: 10.0000 mg | ORAL_TABLET | Freq: Once | ORAL | 0 refills | Status: AC

## 2018-09-03 NOTE — Telephone Encounter (Signed)
Patient called to request pre-medication for MRI scheduled 09/22/18 at [place]; sad it would be helpful to have something to relax her for the MRI scan. [facility]acy is [facility] (said no allergies to medicines that she knows of. Patient Ph# [phone]

## 2018-09-22 ENCOUNTER — Other Ambulatory Visit: Payer: Self-pay

## 2018-09-22 ENCOUNTER — Ambulatory Visit
Admission: RE | Admit: 2018-09-22 | Discharge: 2018-09-22 | Disposition: A | Discharge status: Home / Self Care | Payer: Medicaid Other | Source: Ambulatory Visit | Attending: Orthopaedic Surgery | Admitting: Orthopaedic Surgery

## 2018-09-22 DIAGNOSIS — M25511 Pain in right shoulder: Secondary | ICD-10-CM

## 2018-09-22 DIAGNOSIS — G8929 Other chronic pain: Secondary | ICD-10-CM

## 2018-10-01 ENCOUNTER — Ambulatory Visit: Payer: Medicaid Other | Admitting: Orthopaedic Surgery

## 2018-10-15 ENCOUNTER — Ambulatory Visit (INDEPENDENT_AMBULATORY_CARE_PROVIDER_SITE_OTHER): Payer: Medicaid Other | Admitting: Orthopaedic Surgery

## 2018-10-15 ENCOUNTER — Other Ambulatory Visit: Payer: Self-pay

## 2018-10-15 ENCOUNTER — Encounter: Payer: Self-pay | Admitting: Orthopaedic Surgery

## 2018-10-15 VITALS — BP 162/101 | HR 104 | Ht 67.0 in | Wt 265.0 lb

## 2018-10-15 DIAGNOSIS — M25511 Pain in right shoulder: Secondary | ICD-10-CM | POA: Diagnosis not present

## 2018-10-15 DIAGNOSIS — G8929 Other chronic pain: Secondary | ICD-10-CM

## 2018-10-15 MED ORDER — HYDROCODONE-ACETAMINOPHEN 5-325 MG PO TABS: ORAL_TABLET | ORAL | 0 refills | Status: DC

## 2018-10-15 NOTE — Progress Notes (Signed)
Patient Betty Lutz [name], female DOB:[birth year], 53 y.o. YBO:[id number]  Chief Complaint  Patient presents with  . Shoulder Pain    R/MRI Review    HPI  [name] is a 53 y.o. female who has chronic right shoulder pain.  She has no new trauma.  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  Mild acromioclavicular and glenohumeral osteoarthritis.  I have explained the findings to her. She does not need surgery.  She wants an injection today.   Body mass index is 41.5 kg/m.  The patient meets the AMA guidelines for Morbid (severe) obesity with a BMI > 40.0 and I have recommended weight loss.   ROS  Review of Systems  All other systems reviewed and are negative.  The following is a summary of the past history medically, past history surgically, known current medicines, social history and family history.  This information is gathered electronically by the computer from prior information and documentation.  I review this each visit and have found including this information at this point in the chart is beneficial and informative.    Past Medical History:  Diagnosis Date  . Depression   . Diabetes mellitus without complication ([place])   . Gout   . High cholesterol   . Hypertension     Past Surgical History:  Procedure Laterality Date  . ABDOMINAL HYSTERECTOMY    . CHOLECYSTECTOMY    . TONSILLECTOMY      Family History  Problem Relation Age of Onset  . Cancer Father        prostate    Social History Social History   Tobacco Use  . Smoking status: Current Every Day Smoker    Packs/day: 0.25    Types: Cigarettes  . Smokeless tobacco: Never Used  Substance Use Topics  . Alcohol use: No    Comment: perviously used 1 can of beer daily  . Drug use: No    No Known Allergies  Current Outpatient Medications  Medication Sig Dispense Refill  . allopurinol (ZYLOPRIM) 100 MG tablet  Take 2 tablets (200 mg total) by mouth 2 (two) times daily. 120 tablet 3  . amLODipine (NORVASC) 5 MG tablet TAKE 1 TABLET EVERY DAY 90 tablet 0  . FLUoxetine (PROZAC) 10 MG capsule Take 1 capsule (10 mg total) by mouth daily. 90 capsule 3  . fluticasone (FLONASE) 50 MCG/ACT nasal spray Place 2 sprays into both nostrils daily. 16 g 5  . HYDROcodone-acetaminophen (NORCO/VICODIN) 5-325 MG tablet One tablet every six hours as needed for acute pain. 25 tablet 0  . losartan (COZAAR) 100 MG tablet Take 1 tablet (100 mg total) by mouth daily. 90 tablet 3  . metFORMIN (GLUCOPHAGE-XR) 500 MG 24 hr tablet TAKE ONE TABLET BY MOUTH DAILY WITH BREAKFAST 90 tablet 3  . naproxen (NAPROSYN) 500 MG tablet Take 1 tablet (500 mg total) by mouth 2 (two) times daily with a meal. 60 tablet 5  . trimethoprim-polymyxin b (POLYTRIM) ophthalmic solution Place 2 drops into both eyes 4 (four) times daily. 10 mL 0  . Vitamin D, Ergocalciferol, (DRISDOL) 50000 UNITS CAPS capsule Take 1 capsule (50,000 Units total) by mouth every 7 (seven) days. 12 capsule 3   No current facility-administered medications for this visit.      Physical Exam  Blood pressure (!) 162/101, pulse (!) 104, height 5\' 7"  (1.702 m), weight 265 lb (120.2 kg).  Constitutional: overall normal hygiene, normal nutrition, well developed,  normal grooming, normal body habitus. Assistive device:none  Musculoskeletal: gait and station Limp none, muscle tone and strength are normal, no tremors or atrophy is present.  .  Neurological: coordination overall normal.  Deep tendon reflex/nerve stretch intact.  Sensation normal.  Cranial nerves II-XII intact.   Skin:   Normal overall no scars, lesions, ulcers or rashes. No psoriasis.  Psychiatric: Alert and oriented x 3.  Recent memory intact, remote memory unclear.  Normal mood and affect. Well groomed.  Good eye contact.  Cardiovascular: overall no swelling, no varicosities, no edema bilaterally, normal  temperatures of the legs and arms, no clubbing, cyanosis and good capillary refill.  Lymphatic: palpation is normal.  Right shoulder has full ROM but pain in the extremes.  NV intact.  All other systems reviewed and are negative   The patient has been educated about the nature of the problem(s) and counseled on treatment options.  The patient appeared to understand what I have discussed and is in agreement with it.  Encounter Diagnosis  Name Primary?  . Chronic right shoulder pain Yes   PROCEDURE NOTE:  The patient request injection, verbal consent was obtained.  The right shoulder was prepped appropriately after time out was performed.   Sterile technique was observed and injection of 1 cc of Depo-Medrol 40 mg with several cc's of plain xylocaine. Anesthesia was provided by ethyl chloride and a 20-gauge needle was used to inject the shoulder area. A posterior approach was used.  The injection was tolerated well.  A band aid dressing was applied.  The patient was advised to apply ice later today and tomorrow to the injection sight as needed.    PLAN Call if any problems.  Precautions discussed.  Continue current medications.   Return to clinic 6 weeks   Electronically Signed Darreld Mclean, MD 10/14/20209:30 AM

## 2018-10-15 NOTE — Patient Instructions (Signed)

## 2018-10-28 ENCOUNTER — Telehealth: Payer: Self-pay | Admitting: Orthopaedic Surgery

## 2018-10-28 NOTE — Telephone Encounter (Signed)
Patient requests refill:  HYDROcodone-acetaminophen (NORCO/VICODIN) 5-325 MG tablet 25 tablet   -[facility]

## 2018-10-28 NOTE — Telephone Encounter (Signed)
Called patient to relay per Dr Brooke Bonito message. Voiced understanding.

## 2018-10-28 NOTE — Telephone Encounter (Signed)
No. She got some on 10-15-2018

## 2018-11-26 ENCOUNTER — Encounter: Payer: Self-pay | Admitting: Orthopaedic Surgery

## 2018-11-26 ENCOUNTER — Other Ambulatory Visit: Payer: Self-pay

## 2018-11-26 ENCOUNTER — Ambulatory Visit (INDEPENDENT_AMBULATORY_CARE_PROVIDER_SITE_OTHER): Payer: Medicaid Other | Admitting: Orthopaedic Surgery

## 2018-11-26 VITALS — BP 184/97 | HR 89 | Ht 67.0 in | Wt 267.0 lb

## 2018-11-26 DIAGNOSIS — F17210 Nicotine dependence, cigarettes, uncomplicated: Secondary | ICD-10-CM

## 2018-11-26 DIAGNOSIS — M25511 Pain in right shoulder: Secondary | ICD-10-CM

## 2018-11-26 DIAGNOSIS — G8929 Other chronic pain: Secondary | ICD-10-CM | POA: Diagnosis not present

## 2018-11-26 MED ORDER — HYDROCODONE-ACETAMINOPHEN 5-325 MG PO TABS: ORAL_TABLET | ORAL | 0 refills | Status: DC

## 2018-11-26 NOTE — Progress Notes (Signed)
PROCEDURE NOTE:  The patient request injection, verbal consent was obtained.  The right shoulder was prepped appropriately after time out was performed.   Sterile technique was observed and injection of 1 cc of Depo-Medrol 40 mg with several cc's of plain xylocaine. Anesthesia was provided by ethyl chloride and a 20-gauge needle was used to inject the shoulder area. A posterior approach was used.  The injection was tolerated well.  A band aid dressing was applied.  The patient was advised to apply ice later today and tomorrow to the injection sight as needed.  Return in six weeks.  I have reviewed the [place] web site prior to prescribing narcotic medicine for this patient.   Electronically Signed Sanjuana Kava, MD 11/25/20209:37 AM

## 2018-11-26 NOTE — Patient Instructions (Signed)

## 2018-12-23 ENCOUNTER — Telehealth: Payer: Self-pay | Admitting: Orthopaedic Surgery

## 2018-12-23 MED ORDER — HYDROCODONE-ACETAMINOPHEN 5-325 MG PO TABS: ORAL_TABLET | ORAL | 0 refills | Status: DC

## 2018-12-23 NOTE — Telephone Encounter (Signed)
Patient requests refill on Hydrocodone/Acetaminophen 5-325  Mgs.  Qty  28  Sig: One tablet every six hours as needed for acute pain.  Patient states she uses [facility]

## 2019-01-07 ENCOUNTER — Ambulatory Visit (INDEPENDENT_AMBULATORY_CARE_PROVIDER_SITE_OTHER): Payer: Medicaid Other | Admitting: Orthopaedic Surgery

## 2019-01-07 ENCOUNTER — Encounter: Payer: Self-pay | Admitting: Orthopaedic Surgery

## 2019-01-07 ENCOUNTER — Other Ambulatory Visit: Payer: Self-pay

## 2019-01-07 DIAGNOSIS — M25511 Pain in right shoulder: Secondary | ICD-10-CM | POA: Diagnosis not present

## 2019-01-07 DIAGNOSIS — G8929 Other chronic pain: Secondary | ICD-10-CM

## 2019-01-07 DIAGNOSIS — F17210 Nicotine dependence, cigarettes, uncomplicated: Secondary | ICD-10-CM

## 2019-01-07 MED ORDER — HYDROCODONE-ACETAMINOPHEN 5-325 MG PO TABS: ORAL_TABLET | ORAL | 0 refills | Status: DC

## 2019-01-07 NOTE — Patient Instructions (Signed)

## 2019-01-07 NOTE — Progress Notes (Signed)
PROCEDURE NOTE:  The patient request injection, verbal consent was obtained.  The right shoulder was prepped appropriately after time out was performed.   Sterile technique was observed and injection of 1 cc of Depo-Medrol 40 mg with several cc's of plain xylocaine. Anesthesia was provided by ethyl chloride and a 20-gauge needle was used to inject the shoulder area. A posterior approach was used.  The injection was tolerated well.  A band aid dressing was applied.  The patient was advised to apply ice later today and tomorrow to the injection sight as needed.  I have reviewed the [place] Controlled Substance Reporting System web site prior to prescribing narcotic medicine for this patient.   Return in six weeks.  Call if any problem.  Precautions discussed.   Electronically Signed Darreld Mclean, MD 1/6/20219:38 AM

## 2019-01-27 ENCOUNTER — Telehealth: Payer: Self-pay | Admitting: Orthopaedic Surgery

## 2019-01-27 MED ORDER — HYDROCODONE-ACETAMINOPHEN 5-325 MG PO TABS: ORAL_TABLET | ORAL | 0 refills | Status: DC

## 2019-01-27 NOTE — Telephone Encounter (Signed)
Patient requests refill: HYDROcodone-acetaminophen (NORCO/VICODIN) 5-325 MG tablet 28 tab  -[facility], [facility]

## 2019-02-18 ENCOUNTER — Encounter: Payer: Self-pay | Admitting: Orthopaedic Surgery

## 2019-02-18 ENCOUNTER — Other Ambulatory Visit: Payer: Self-pay

## 2019-02-18 ENCOUNTER — Ambulatory Visit (INDEPENDENT_AMBULATORY_CARE_PROVIDER_SITE_OTHER): Payer: Medicaid Other | Admitting: Orthopaedic Surgery

## 2019-02-18 VITALS — Ht 67.0 in | Wt 266.4 lb

## 2019-02-18 DIAGNOSIS — M25511 Pain in right shoulder: Secondary | ICD-10-CM | POA: Diagnosis not present

## 2019-02-18 DIAGNOSIS — G8929 Other chronic pain: Secondary | ICD-10-CM

## 2019-02-18 DIAGNOSIS — F17210 Nicotine dependence, cigarettes, uncomplicated: Secondary | ICD-10-CM

## 2019-02-18 MED ORDER — HYDROCODONE-ACETAMINOPHEN 5-325 MG PO TABS: ORAL_TABLET | ORAL | 0 refills | Status: DC

## 2019-02-18 NOTE — Progress Notes (Signed)
PROCEDURE NOTE:  The patient request injection, verbal consent was obtained.  The right shoulder was prepped appropriately after time out was performed.   Sterile technique was observed and injection of 1 cc of Depo-Medrol 40 mg with several cc's of plain xylocaine. Anesthesia was provided by ethyl chloride and a 20-gauge needle was used to inject the shoulder area. A posterior approach was used.  The injection was tolerated well.  A band aid dressing was applied.  The patient was advised to apply ice later today and tomorrow to the injection sight as needed.  Return in six weeks.  I have reviewed the [place] Controlled Substance Reporting System web site prior to prescribing narcotic medicine for this patient.   Electronically Signed Darreld Mclean, MD 2/17/202110:03 AM

## 2019-02-18 NOTE — Patient Instructions (Signed)

## 2019-03-05 ENCOUNTER — Telehealth: Payer: Self-pay | Admitting: Orthopaedic Surgery

## 2019-03-05 MED ORDER — HYDROCODONE-ACETAMINOPHEN 5-325 MG PO TABS: ORAL_TABLET | ORAL | 0 refills | Status: DC

## 2019-03-05 NOTE — Telephone Encounter (Signed)
Patient requests refill on Hydrocodone/Acetaminophen 5-325  Mgs.  Qty  25  Sig: One tablet every six hours as needed for acute pain.  Patient states she uses [facility] in [place]

## 2019-03-26 ENCOUNTER — Telehealth: Payer: Self-pay | Admitting: Orthopaedic Surgery

## 2019-03-26 MED ORDER — HYDROCODONE-ACETAMINOPHEN 5-325 MG PO TABS: ORAL_TABLET | ORAL | 0 refills | Status: DC

## 2019-03-26 NOTE — Telephone Encounter (Signed)
Patient called to request refill: HYDROcodone-acetaminophen (NORCO/VICODIN) 5-325 MG tablet (last quantity: 22) [facility], [facility]

## 2019-04-01 ENCOUNTER — Ambulatory Visit: Payer: Medicaid Other | Admitting: Orthopaedic Surgery

## 2019-04-09 ENCOUNTER — Other Ambulatory Visit: Payer: Self-pay

## 2019-04-09 ENCOUNTER — Encounter: Payer: Self-pay | Admitting: Orthopaedic Surgery

## 2019-04-09 ENCOUNTER — Ambulatory Visit (INDEPENDENT_AMBULATORY_CARE_PROVIDER_SITE_OTHER): Payer: Medicaid Other | Admitting: Orthopaedic Surgery

## 2019-04-09 VITALS — Temp 96.6°F | Ht 67.0 in | Wt 259.2 lb

## 2019-04-09 DIAGNOSIS — F17210 Nicotine dependence, cigarettes, uncomplicated: Secondary | ICD-10-CM

## 2019-04-09 DIAGNOSIS — M25511 Pain in right shoulder: Secondary | ICD-10-CM | POA: Diagnosis not present

## 2019-04-09 DIAGNOSIS — G8929 Other chronic pain: Secondary | ICD-10-CM | POA: Diagnosis not present

## 2019-04-09 MED ORDER — HYDROCODONE-ACETAMINOPHEN 5-325 MG PO TABS: ORAL_TABLET | ORAL | 0 refills | Status: DC

## 2019-04-09 NOTE — Patient Instructions (Signed)

## 2019-04-09 NOTE — Progress Notes (Signed)
PROCEDURE NOTE:  The patient request injection, verbal consent was obtained.  The right shoulder was prepped appropriately after time out was performed.   Sterile technique was observed and injection of 1 cc of Depo-Medrol 40 mg with several cc's of plain xylocaine. Anesthesia was provided by ethyl chloride and a 20-gauge needle was used to inject the shoulder area. A posterior approach was used.  The injection was tolerated well.  A band aid dressing was applied.  The patient was advised to apply ice later today and tomorrow to the injection sight as needed.  I have reviewed the [place] Controlled Substance Reporting System web site prior to prescribing narcotic medicine for this patient.   Return in six weeks.  Electronically Signed Darreld Mclean, MD 4/8/20219:56 AM

## 2019-04-23 ENCOUNTER — Telehealth: Payer: Self-pay | Admitting: Orthopaedic Surgery

## 2019-04-23 MED ORDER — HYDROCODONE-ACETAMINOPHEN 5-325 MG PO TABS: ORAL_TABLET | ORAL | 0 refills | Status: DC

## 2019-04-23 NOTE — Telephone Encounter (Signed)
Patient requests refill on Hydrocodone/Acetaminophen 5-325  Mgs.  Qty  22      Sig: One tablet every six hours as needed for acute pain.    Patient states she uses [facility] in [place]

## 2019-05-07 ENCOUNTER — Telehealth: Payer: Self-pay | Admitting: Orthopaedic Surgery

## 2019-05-07 MED ORDER — HYDROCODONE-ACETAMINOPHEN 5-325 MG PO TABS: ORAL_TABLET | ORAL | 0 refills | Status: DC

## 2019-05-07 NOTE — Telephone Encounter (Signed)
Patient requests refill on Hydrocodone/Acetaminophen 5-325  Mgs.  Qty  22  Sig: One tablet every six hours as needed for acute pain.  Patient states she uses [name] in [place] 

## 2019-05-21 ENCOUNTER — Telehealth: Payer: Self-pay | Admitting: Orthopaedic Surgery

## 2019-05-21 MED ORDER — HYDROCODONE-ACETAMINOPHEN 5-325 MG PO TABS: ORAL_TABLET | ORAL | 0 refills | Status: DC

## 2019-05-21 NOTE — Telephone Encounter (Signed)
Patient requests refill on Hydrocodone/Acetaminophen 5-325  Mgs.  Qty  22  Sig: One tablet every six hours as needed for acute pain.  Patient states she uses [name] in [place]

## 2019-05-27 ENCOUNTER — Other Ambulatory Visit: Payer: Self-pay

## 2019-05-27 ENCOUNTER — Ambulatory Visit: Payer: Medicaid Other | Admitting: Orthopaedic Surgery

## 2019-06-10 ENCOUNTER — Ambulatory Visit (INDEPENDENT_AMBULATORY_CARE_PROVIDER_SITE_OTHER): Payer: Medicaid Other | Admitting: Orthopaedic Surgery

## 2019-06-10 ENCOUNTER — Other Ambulatory Visit: Payer: Self-pay

## 2019-06-10 ENCOUNTER — Encounter: Payer: Self-pay | Admitting: Orthopaedic Surgery

## 2019-06-10 VITALS — BP 187/104 | HR 92 | Ht 67.0 in | Wt 250.0 lb

## 2019-06-10 DIAGNOSIS — G8929 Other chronic pain: Secondary | ICD-10-CM

## 2019-06-10 DIAGNOSIS — F17210 Nicotine dependence, cigarettes, uncomplicated: Secondary | ICD-10-CM

## 2019-06-10 DIAGNOSIS — M25511 Pain in right shoulder: Secondary | ICD-10-CM

## 2019-06-10 MED ORDER — HYDROCODONE-ACETAMINOPHEN 5-325 MG PO TABS: ORAL_TABLET | ORAL | 0 refills | Status: DC

## 2019-06-10 NOTE — Progress Notes (Signed)
PROCEDURE NOTE:  The patient request injection, verbal consent was obtained.  The right shoulder was prepped appropriately after time out was performed.   Sterile technique was observed and injection of 1 cc of Depo-Medrol 40 mg with several cc's of plain xylocaine. Anesthesia was provided by ethyl chloride and a 20-gauge needle was used to inject the shoulder area. A posterior approach was used.  The injection was tolerated well.  A band aid dressing was applied.  The patient was advised to apply ice later today and tomorrow to the injection sight as needed.  I have reviewed the [place] Controlled Substance Reporting System web site prior to prescribing narcotic medicine for this patient.   Electronically Signed Darreld Mclean, MD 6/9/202111:03 AM

## 2019-06-10 NOTE — Patient Instructions (Signed)

## 2019-06-25 ENCOUNTER — Telehealth: Payer: Self-pay | Admitting: Orthopaedic Surgery

## 2019-06-25 MED ORDER — HYDROCODONE-ACETAMINOPHEN 5-325 MG PO TABS: ORAL_TABLET | ORAL | 0 refills | Status: DC

## 2019-06-25 NOTE — Telephone Encounter (Signed)
Patient called for refill: HYDROcodone-acetaminophen (NORCO/VICODIN) 5-325 MG tablet [34505] / [facility], [place] 

## 2019-07-09 ENCOUNTER — Telehealth: Payer: Self-pay | Admitting: Orthopaedic Surgery

## 2019-07-09 MED ORDER — HYDROCODONE-ACETAMINOPHEN 5-325 MG PO TABS: ORAL_TABLET | ORAL | 0 refills | Status: DC

## 2019-07-09 NOTE — Telephone Encounter (Signed)
Patient called for refill: HYDROcodone-acetaminophen (NORCO/VICODIN) 5-325 MG tablet [34505] / [facility], [place]

## 2019-07-22 ENCOUNTER — Other Ambulatory Visit: Payer: Self-pay

## 2019-07-22 ENCOUNTER — Encounter: Payer: Self-pay | Admitting: Orthopaedic Surgery

## 2019-07-22 ENCOUNTER — Ambulatory Visit (INDEPENDENT_AMBULATORY_CARE_PROVIDER_SITE_OTHER): Payer: Medicaid Other | Admitting: Orthopaedic Surgery

## 2019-07-22 DIAGNOSIS — G8929 Other chronic pain: Secondary | ICD-10-CM | POA: Diagnosis not present

## 2019-07-22 DIAGNOSIS — M25511 Pain in right shoulder: Secondary | ICD-10-CM

## 2019-07-22 DIAGNOSIS — F17210 Nicotine dependence, cigarettes, uncomplicated: Secondary | ICD-10-CM

## 2019-07-22 MED ORDER — HYDROCODONE-ACETAMINOPHEN 5-325 MG PO TABS: ORAL_TABLET | ORAL | 0 refills | Status: DC

## 2019-07-22 NOTE — Progress Notes (Signed)
PROCEDURE NOTE:  The patient request injection, verbal consent was obtained.  The right shoulder was prepped appropriately after time out was performed.   Sterile technique was observed and injection of 1 cc of Depo-Medrol 40 mg with several cc's of plain xylocaine. Anesthesia was provided by ethyl chloride and a 20-gauge needle was used to inject the shoulder area. A posterior approach was used.  The injection was tolerated well.  A band aid dressing was applied.  The patient was advised to apply ice later today and tomorrow to the injection sight as needed.  I have reviewed the [place] Controlled Substance Reporting System web site prior to prescribing narcotic medicine for this patient.   I will see as needed.  Electronically Signed Darreld Mclean, MD 7/21/202110:23 AM

## 2019-07-30 ENCOUNTER — Telehealth: Payer: Self-pay | Admitting: Orthopaedic Surgery

## 2019-07-30 MED ORDER — HYDROCODONE-ACETAMINOPHEN 5-325 MG PO TABS: ORAL_TABLET | ORAL | 0 refills | Status: DC

## 2019-07-30 NOTE — Telephone Encounter (Signed)
Patient requests refill on Hydrocodone/Acetaminophen 5-325  Mgs.  Qty 20  Sig: One tablet every six hours as needed for acute pain.  Patient states she uses [facility] in [place]

## 2019-07-30 NOTE — Telephone Encounter (Signed)
[facility] called via voice mesesage to request ICD-10 Diagnosis code in order to refill requested medication - states 2 fills per insurance, then diagnosis code to be linked. Ph# [phone]. "Cover my meds" request also in nurse box.  Patient aware of status.

## 2019-07-31 NOTE — Telephone Encounter (Signed)
I called [facility] and advised dx code is M25.511 and is chronic right shld pain.

## 2019-08-11 ENCOUNTER — Telehealth: Payer: Self-pay | Admitting: Orthopaedic Surgery

## 2019-08-11 MED ORDER — HYDROCODONE-ACETAMINOPHEN 5-325 MG PO TABS: ORAL_TABLET | ORAL | 0 refills | Status: DC

## 2019-08-11 NOTE — Telephone Encounter (Signed)
Patient requests refill on Hydrocodone/Acetaminophen 5-325  Mgs.  Qty 20  Sig: One tablet every six hours as needed for acute pain.  Patient uses [facility] in [place]

## 2019-08-25 ENCOUNTER — Other Ambulatory Visit: Payer: Self-pay | Admitting: Orthopedic Surgery

## 2019-08-25 MED ORDER — HYDROCODONE-ACETAMINOPHEN 5-325 MG PO TABS: ORAL_TABLET | ORAL | 0 refills | Status: DC

## 2019-08-25 NOTE — Telephone Encounter (Signed)
Patient called the office for a refill of her Hydrocodone 5-325 mg. This is a patient of Dr. Hilda Lias and it was last filled on 08/11/2019 for 18 tablets.   Patient takes one tablet every six hours as needed for acute pain.   Patient uses [facility]. Please advise.

## 2019-09-03 ENCOUNTER — Telehealth: Payer: Self-pay | Admitting: Orthopaedic Surgery

## 2019-09-03 MED ORDER — HYDROCODONE-ACETAMINOPHEN 5-325 MG PO TABS: ORAL_TABLET | ORAL | 0 refills | Status: DC

## 2019-09-03 NOTE — Telephone Encounter (Signed)
  Patient requests refill on Hydrocodone/Acetaminophen 5-325  Mgs.  Qty 18  Sig: One tablet every six hours as needed for acute pain.  Patient states she uses [name] in [place]

## 2019-09-10 ENCOUNTER — Ambulatory Visit: Payer: Medicaid Other | Admitting: Orthopaedic Surgery

## 2019-09-15 ENCOUNTER — Encounter: Payer: Self-pay | Admitting: Orthopaedic Surgery

## 2019-09-15 ENCOUNTER — Ambulatory Visit (INDEPENDENT_AMBULATORY_CARE_PROVIDER_SITE_OTHER): Payer: Medicaid Other | Admitting: Orthopaedic Surgery

## 2019-09-15 ENCOUNTER — Other Ambulatory Visit: Payer: Self-pay

## 2019-09-15 VITALS — Ht 67.0 in | Wt 253.0 lb

## 2019-09-15 DIAGNOSIS — F17210 Nicotine dependence, cigarettes, uncomplicated: Secondary | ICD-10-CM

## 2019-09-15 DIAGNOSIS — M25511 Pain in right shoulder: Secondary | ICD-10-CM | POA: Diagnosis not present

## 2019-09-15 DIAGNOSIS — G8929 Other chronic pain: Secondary | ICD-10-CM | POA: Diagnosis not present

## 2019-09-15 MED ORDER — HYDROCODONE-ACETAMINOPHEN 5-325 MG PO TABS: ORAL_TABLET | ORAL | 0 refills | Status: DC

## 2019-09-15 NOTE — Progress Notes (Signed)
PROCEDURE NOTE:  The patient request injection, verbal consent was obtained.  The right shoulder was prepped appropriately after time out was performed.   Sterile technique was observed and injection of 1 cc of Depo-Medrol 40 mg with several cc's of plain xylocaine. Anesthesia was provided by ethyl chloride and a 20-gauge needle was used to inject the shoulder area. A posterior approach was used.  The injection was tolerated well.  A band aid dressing was applied.  The patient was advised to apply ice later today and tomorrow to the injection sight as needed.  I have reviewed the [place] Controlled Substance Reporting System web site prior to prescribing narcotic medicine for this patient.   Return as needed.  Electronically Signed Darreld Mclean, MD 9/14/202110:30 AM

## 2019-09-22 ENCOUNTER — Telehealth: Payer: Self-pay | Admitting: Orthopaedic Surgery

## 2019-09-22 MED ORDER — HYDROCODONE-ACETAMINOPHEN 5-325 MG PO TABS: ORAL_TABLET | ORAL | 0 refills | Status: DC

## 2019-09-22 NOTE — Telephone Encounter (Signed)
Patient requests refill on Hydrocodone/Acetaminophen 5-325 Mgs. Qty18  Sig:One tablet every six hours as needed for acute pain.  Patient states she uses [facility] in [place] 

## 2019-09-29 ENCOUNTER — Telehealth: Payer: Self-pay | Admitting: Orthopaedic Surgery

## 2019-09-29 MED ORDER — HYDROCODONE-ACETAMINOPHEN 5-325 MG PO TABS: ORAL_TABLET | ORAL | 0 refills | Status: DC

## 2019-09-29 NOTE — Telephone Encounter (Signed)
Patient requests refill on Hydrocodone/Acetaminophen 5-325 Mgs. Betty Lutz  Sig:One tablet every six hours as needed for acute pain.  Patient states she uses [facility] in [place]

## 2019-10-07 ENCOUNTER — Telehealth: Payer: Self-pay | Admitting: Orthopaedic Surgery

## 2019-10-07 MED ORDER — HYDROCODONE-ACETAMINOPHEN 5-325 MG PO TABS: ORAL_TABLET | ORAL | 0 refills | Status: DC

## 2019-10-07 NOTE — Telephone Encounter (Signed)
Patient requests refill on Hydrocodone/Acetaminophen 5-325 Mgs. Betty Lutz  Sig:One tablet every six hours as needed for acute pain.  Patient states she uses [facility] in [place]

## 2019-10-15 ENCOUNTER — Telehealth: Payer: Self-pay | Admitting: Orthopaedic Surgery

## 2019-10-15 MED ORDER — HYDROCODONE-ACETAMINOPHEN 5-325 MG PO TABS: ORAL_TABLET | ORAL | 0 refills | Status: DC

## 2019-10-15 NOTE — Telephone Encounter (Signed)
Patient requests refill:  HYDROcodone-acetaminophen (NORCO/VICODIN) 5-325 MG tablet 16 tablet  -[facility], [place]

## 2019-10-22 ENCOUNTER — Telehealth: Payer: Self-pay | Admitting: Orthopaedic Surgery

## 2019-10-22 MED ORDER — HYDROCODONE-ACETAMINOPHEN 5-325 MG PO TABS: ORAL_TABLET | ORAL | 0 refills | Status: DC

## 2019-10-22 NOTE — Telephone Encounter (Signed)
Patient called for refill:  HYDROcodone-acetaminophen (NORCO/VICODIN) 5-325 MG tablet 16 tablet  -[facility]

## 2019-10-29 ENCOUNTER — Telehealth: Payer: Self-pay | Admitting: Orthopaedic Surgery

## 2019-10-29 MED ORDER — HYDROCODONE-ACETAMINOPHEN 5-325 MG PO TABS: ORAL_TABLET | ORAL | 0 refills | Status: DC

## 2019-10-29 NOTE — Telephone Encounter (Signed)
Patient called for refill:  HYDROcodone-acetaminophen (NORCO/VICODIN) 5-325 MG tablet 16 tablet  -[facility]  

## 2019-11-09 ENCOUNTER — Telehealth: Payer: Self-pay | Admitting: Orthopaedic Surgery

## 2019-11-09 NOTE — Telephone Encounter (Signed)
HYDROcodone-acetaminophen (NORCO/VICODIN) 5-325 MG tablet 14 tablet  -[facility]

## 2019-11-17 ENCOUNTER — Telehealth: Payer: Self-pay | Admitting: Orthopaedic Surgery

## 2019-11-17 MED ORDER — HYDROCODONE-ACETAMINOPHEN 5-325 MG PO TABS: ORAL_TABLET | ORAL | 0 refills | Status: DC

## 2019-11-17 NOTE — Telephone Encounter (Signed)
Patient called for refill:  HYDROcodone-acetaminophen (NORCO/VICODIN) 5-325 MG tablet 14 tablet  [facility], Betty Lutz is pharmacy

## 2019-12-09 ENCOUNTER — Encounter: Payer: Self-pay | Admitting: Orthopaedic Surgery

## 2019-12-09 ENCOUNTER — Ambulatory Visit (INDEPENDENT_AMBULATORY_CARE_PROVIDER_SITE_OTHER): Payer: Medicaid Other | Admitting: Orthopaedic Surgery

## 2019-12-09 ENCOUNTER — Other Ambulatory Visit: Payer: Self-pay

## 2019-12-09 DIAGNOSIS — M25511 Pain in right shoulder: Secondary | ICD-10-CM

## 2019-12-09 DIAGNOSIS — G8929 Other chronic pain: Secondary | ICD-10-CM

## 2019-12-09 MED ORDER — HYDROCODONE-ACETAMINOPHEN 5-325 MG PO TABS: ORAL_TABLET | ORAL | 0 refills | Status: DC

## 2019-12-09 NOTE — Progress Notes (Signed)
PROCEDURE NOTE:  The patient request injection, verbal consent was obtained.  The right shoulder was prepped appropriately after time out was performed.   Sterile technique was observed and injection of 1 cc of Depo-Medrol 40 mg with several cc's of plain xylocaine. Anesthesia was provided by ethyl chloride and a 20-gauge needle was used to inject the shoulder area. A posterior approach was used.  The injection was tolerated well.  A band aid dressing was applied.  The patient was advised to apply ice later today and tomorrow to the injection sight as needed.  See as needed.  I have reviewed the [place] Controlled Substance Reporting System web site prior to prescribing narcotic medicine for this patient.   Electronically Signed Darreld Mclean, MD 12/8/202110:18 AM

## 2019-12-16 ENCOUNTER — Other Ambulatory Visit: Payer: Self-pay | Admitting: Orthopedic Surgery

## 2019-12-16 NOTE — Telephone Encounter (Signed)
Patient of Dr Sanjuan Dame - aware Dr Hilda Lias out of clinic until after first of year, requests refill: HYDROcodone-acetaminophen (NORCO/VICODIN) 5-325 MG tablet  [facility], [place]

## 2019-12-16 NOTE — Telephone Encounter (Signed)
Wrong one again but I did not do it

## 2019-12-17 ENCOUNTER — Other Ambulatory Visit: Payer: Self-pay | Admitting: Orthopedic Surgery

## 2019-12-17 ENCOUNTER — Telehealth: Payer: Self-pay

## 2019-12-17 MED ORDER — HYDROCODONE-ACETAMINOPHEN 5-325 MG PO TABS: ORAL_TABLET | ORAL | 0 refills | Status: DC

## 2019-12-17 NOTE — Telephone Encounter (Signed)
Dr. Charm Rings patient     Hydrocodone-acetaminophen 5/325 mg tablets  Take one (1) tablet by mouth every six (6) hours as needed for acute pain  PATIENT USES [facility] IN [place]

## 2019-12-28 ENCOUNTER — Telehealth: Payer: Self-pay

## 2019-12-28 ENCOUNTER — Other Ambulatory Visit: Payer: Self-pay | Admitting: Orthopedic Surgery

## 2019-12-28 MED ORDER — HYDROCODONE-ACETAMINOPHEN 5-325 MG PO TABS: ORAL_TABLET | ORAL | 0 refills | Status: DC

## 2019-12-28 NOTE — Telephone Encounter (Signed)
Dr. Charm Rings patient  Hydrocodone-Acetaminophen 5/325mg   Take one tablet by mouth every 6 hours as needed for acute pain.  PATIENT USES [facility] IN [place]

## 2020-01-05 ENCOUNTER — Telehealth: Payer: Self-pay | Admitting: Orthopaedic Surgery

## 2020-01-05 MED ORDER — HYDROCODONE-ACETAMINOPHEN 5-325 MG PO TABS: ORAL_TABLET | ORAL | 0 refills | Status: DC

## 2020-01-05 NOTE — Telephone Encounter (Signed)
Patient called for refill: HYDROcodone-acetaminophen (NORCO/VICODIN) 5-325 MG tablet 14 tablet  [facility], [place]

## 2020-01-12 ENCOUNTER — Telehealth: Payer: Self-pay | Admitting: Orthopaedic Surgery

## 2020-01-21 ENCOUNTER — Telehealth: Payer: Self-pay | Admitting: Orthopaedic Surgery

## 2020-01-21 NOTE — Telephone Encounter (Signed)
Rx request 

## 2020-01-28 ENCOUNTER — Telehealth: Payer: Self-pay | Admitting: Orthopaedic Surgery

## 2020-02-03 ENCOUNTER — Encounter: Payer: Self-pay | Admitting: Orthopaedic Surgery

## 2020-02-03 ENCOUNTER — Ambulatory Visit (INDEPENDENT_AMBULATORY_CARE_PROVIDER_SITE_OTHER): Payer: Medicaid Other | Admitting: Orthopaedic Surgery

## 2020-02-03 DIAGNOSIS — G8929 Other chronic pain: Secondary | ICD-10-CM | POA: Diagnosis not present

## 2020-02-03 DIAGNOSIS — M25511 Pain in right shoulder: Secondary | ICD-10-CM

## 2020-02-03 DIAGNOSIS — F17210 Nicotine dependence, cigarettes, uncomplicated: Secondary | ICD-10-CM

## 2020-02-03 MED ORDER — HYDROCODONE-ACETAMINOPHEN 5-325 MG PO TABS: ORAL_TABLET | ORAL | 0 refills | Status: DC

## 2020-02-03 NOTE — Progress Notes (Signed)
PROCEDURE NOTE:  The patient request injection, verbal consent was obtained.  The right shoulder was prepped appropriately after time out was performed.   Sterile technique was observed and injection of 1 cc of Depo-Medrol 40 mg with several cc's of plain xylocaine. Anesthesia was provided by ethyl chloride and a 20-gauge needle was used to inject the shoulder area. A posterior approach was used.  The injection was tolerated well.  A band aid dressing was applied.  The patient was advised to apply ice later today and tomorrow to the injection sight as needed.  Return in one month.  I have reviewed the [place] Controlled Substance Reporting System web site prior to prescribing narcotic medicine for this patient.   Electronically Signed Darreld Mclean, MD 2/2/202210:43 AM

## 2020-02-16 ENCOUNTER — Telehealth: Payer: Self-pay | Admitting: Orthopaedic Surgery

## 2020-03-02 ENCOUNTER — Ambulatory Visit (INDEPENDENT_AMBULATORY_CARE_PROVIDER_SITE_OTHER): Payer: Medicaid Other | Admitting: Orthopaedic Surgery

## 2020-03-02 ENCOUNTER — Encounter: Payer: Self-pay | Admitting: Orthopaedic Surgery

## 2020-03-02 ENCOUNTER — Other Ambulatory Visit: Payer: Self-pay

## 2020-03-02 VITALS — BP 132/92 | HR 116 | Ht 67.0 in | Wt 253.0 lb

## 2020-03-02 DIAGNOSIS — M25511 Pain in right shoulder: Secondary | ICD-10-CM

## 2020-03-02 DIAGNOSIS — G8929 Other chronic pain: Secondary | ICD-10-CM | POA: Diagnosis not present

## 2020-03-02 DIAGNOSIS — F17210 Nicotine dependence, cigarettes, uncomplicated: Secondary | ICD-10-CM

## 2020-03-02 MED ORDER — HYDROCODONE-ACETAMINOPHEN 5-325 MG PO TABS: ORAL_TABLET | ORAL | 0 refills | Status: DC

## 2020-03-02 NOTE — Progress Notes (Signed)
PROCEDURE NOTE:  The patient request injection, verbal consent was obtained.  The right shoulder was prepped appropriately after time out was performed.   Sterile technique was observed and injection of 1 cc of Celestone 6 mg with several cc's of plain xylocaine. Anesthesia was provided by ethyl chloride and a 20-gauge needle was used to inject the shoulder area. A posterior approach was used.  The injection was tolerated well.  A band aid dressing was applied.  The patient was advised to apply ice later today and tomorrow to the injection sight as needed.  I have reviewed the [place] Controlled Substance Reporting System web site prior to prescribing narcotic medicine for this patient.   Return in one month.  Call if any problem.  Precautions discussed.   Electronically Signed Darreld Mclean, MD 3/2/20229:32 AM

## 2020-03-02 NOTE — Patient Instructions (Signed)

## 2020-03-15 ENCOUNTER — Telehealth: Payer: Self-pay | Admitting: Orthopaedic Surgery

## 2020-03-15 NOTE — Telephone Encounter (Signed)
Rx request 

## 2020-03-30 ENCOUNTER — Encounter: Payer: Self-pay | Admitting: Orthopaedic Surgery

## 2020-03-30 ENCOUNTER — Other Ambulatory Visit: Payer: Self-pay

## 2020-03-30 ENCOUNTER — Telehealth: Payer: Self-pay | Admitting: Radiology

## 2020-03-30 ENCOUNTER — Ambulatory Visit (INDEPENDENT_AMBULATORY_CARE_PROVIDER_SITE_OTHER): Payer: Medicaid Other | Admitting: Orthopaedic Surgery

## 2020-03-30 DIAGNOSIS — F17210 Nicotine dependence, cigarettes, uncomplicated: Secondary | ICD-10-CM

## 2020-03-30 DIAGNOSIS — G8929 Other chronic pain: Secondary | ICD-10-CM | POA: Diagnosis not present

## 2020-03-30 DIAGNOSIS — M25511 Pain in right shoulder: Secondary | ICD-10-CM | POA: Diagnosis not present

## 2020-03-30 MED ORDER — HYDROCODONE-ACETAMINOPHEN 5-325 MG PO TABS
1.0000 | ORAL_TABLET | ORAL | 0 refills | Status: DC | PRN
Start: 2020-03-30 — End: 2020-03-30

## 2020-03-30 MED ORDER — HYDROCODONE-ACETAMINOPHEN 5-325 MG PO TABS: 1.0000 | ORAL_TABLET | ORAL | 0 refills | Status: DC | PRN

## 2020-03-30 MED ORDER — HYDROCODONE-ACETAMINOPHEN 5-325 MG PO TABS
1.0000 | ORAL_TABLET | ORAL | 0 refills | Status: DC | PRN
Start: 2020-03-30 — End: 2020-04-14

## 2020-03-30 NOTE — Progress Notes (Signed)
PROCEDURE NOTE:  The patient request injection, verbal consent was obtained.  The right shoulder was prepped appropriately after time out was performed.   Sterile technique was observed and injection of 1 cc of Celestone 6 mg with several cc's of plain xylocaine. Anesthesia was provided by ethyl chloride and a 20-gauge needle was used to inject the shoulder area. A posterior approach was used.  The injection was tolerated well.  A band aid dressing was applied.  The patient was advised to apply ice later today and tomorrow to the injection sight as needed.  Return in one month.  I have reviewed the [place] Controlled Substance Reporting System web site prior to prescribing narcotic medicine for this patient.   Electronically Signed Darreld Mclean, MD 3/30/20229:14 AM

## 2020-03-30 NOTE — Telephone Encounter (Signed)
Pharmacy did not receive the prescription from this AM.  Will you please resend?  Thanks.

## 2020-03-30 NOTE — Addendum Note (Signed)
Addended by: Earnstine Regal on: 03/30/2020 03:33 PM   Modules accepted: Orders

## 2020-03-30 NOTE — Addendum Note (Signed)
Addended by: Earnstine Regal on: 03/30/2020 03:37 PM   Modules accepted: Orders

## 2020-04-13 ENCOUNTER — Telehealth: Payer: Self-pay | Admitting: Orthopaedic Surgery

## 2020-04-13 NOTE — Telephone Encounter (Signed)
Rx refill request

## 2020-04-27 ENCOUNTER — Ambulatory Visit (INDEPENDENT_AMBULATORY_CARE_PROVIDER_SITE_OTHER): Payer: Medicaid Other | Admitting: Orthopaedic Surgery

## 2020-04-27 ENCOUNTER — Encounter: Payer: Self-pay | Admitting: Orthopaedic Surgery

## 2020-04-27 ENCOUNTER — Other Ambulatory Visit: Payer: Self-pay

## 2020-04-27 DIAGNOSIS — M25511 Pain in right shoulder: Secondary | ICD-10-CM | POA: Diagnosis not present

## 2020-04-27 DIAGNOSIS — G8929 Other chronic pain: Secondary | ICD-10-CM | POA: Diagnosis not present

## 2020-04-27 DIAGNOSIS — F17210 Nicotine dependence, cigarettes, uncomplicated: Secondary | ICD-10-CM

## 2020-04-27 MED ORDER — HYDROCODONE-ACETAMINOPHEN 5-325 MG PO TABS: ORAL_TABLET | ORAL | 0 refills | Status: DC

## 2020-04-27 NOTE — Patient Instructions (Signed)

## 2020-04-27 NOTE — Progress Notes (Signed)
PROCEDURE NOTE:  The patient request injection, verbal consent was obtained.  The right shoulder was prepped appropriately after time out was performed.   Sterile technique was observed and injection of 1 cc of Celestone 6 mg with several cc's of plain xylocaine. Anesthesia was provided by ethyl chloride and a 20-gauge needle was used to inject the shoulder area. A posterior approach was used.  The injection was tolerated well.  A band aid dressing was applied.  The patient was advised to apply ice later today and tomorrow to the injection sight as needed.  I have reviewed the [place] Controlled Substance Reporting System web site prior to prescribing narcotic medicine for this patient.   Return in one month.  Electronically Signed Darreld Mclean, MD 4/27/20229:27 AM

## 2020-05-09 ENCOUNTER — Other Ambulatory Visit: Payer: Self-pay | Admitting: Orthopaedic Surgery

## 2020-05-09 NOTE — Telephone Encounter (Signed)
I am going to give Rx twice a month.  She has to wait another month.  She got it 4/27

## 2020-05-25 ENCOUNTER — Ambulatory Visit (INDEPENDENT_AMBULATORY_CARE_PROVIDER_SITE_OTHER): Payer: Medicaid Other | Admitting: Orthopaedic Surgery

## 2020-05-25 ENCOUNTER — Other Ambulatory Visit: Payer: Self-pay

## 2020-05-25 ENCOUNTER — Encounter: Payer: Self-pay | Admitting: Orthopaedic Surgery

## 2020-05-25 DIAGNOSIS — F17210 Nicotine dependence, cigarettes, uncomplicated: Secondary | ICD-10-CM

## 2020-05-25 DIAGNOSIS — G8929 Other chronic pain: Secondary | ICD-10-CM

## 2020-05-25 DIAGNOSIS — M25511 Pain in right shoulder: Secondary | ICD-10-CM

## 2020-05-25 MED ORDER — HYDROCODONE-ACETAMINOPHEN 5-325 MG PO TABS: ORAL_TABLET | ORAL | 0 refills | Status: DC

## 2020-05-25 NOTE — Progress Notes (Signed)
PROCEDURE NOTE:  The patient request injection, verbal consent was obtained.  The right shoulder was prepped appropriately after time out was performed.   Sterile technique was observed and injection of 1 cc of Celestone 6 mg with several cc's of plain xylocaine. Anesthesia was provided by ethyl chloride and a 20-gauge needle was used to inject the shoulder area. A posterior approach was used.  The injection was tolerated well.  A band aid dressing was applied.  The patient was advised to apply ice later today and tomorrow to the injection sight as needed.  Return in one month.  I have reviewed the [place] Controlled Substance Reporting System web site prior to prescribing narcotic medicine for this patient.   Call if any problem.  Precautions discussed.   Electronically Signed Darreld Mclean, MD 5/25/20229:02 AM

## 2020-06-09 ENCOUNTER — Telehealth: Payer: Self-pay | Admitting: Orthopaedic Surgery

## 2020-06-22 ENCOUNTER — Ambulatory Visit (INDEPENDENT_AMBULATORY_CARE_PROVIDER_SITE_OTHER): Payer: Medicaid Other | Admitting: Orthopaedic Surgery

## 2020-06-22 ENCOUNTER — Other Ambulatory Visit: Payer: Self-pay

## 2020-06-22 ENCOUNTER — Encounter: Payer: Self-pay | Admitting: Orthopaedic Surgery

## 2020-06-22 DIAGNOSIS — G8929 Other chronic pain: Secondary | ICD-10-CM | POA: Diagnosis not present

## 2020-06-22 DIAGNOSIS — F17210 Nicotine dependence, cigarettes, uncomplicated: Secondary | ICD-10-CM

## 2020-06-22 DIAGNOSIS — M25511 Pain in right shoulder: Secondary | ICD-10-CM | POA: Diagnosis not present

## 2020-06-22 MED ORDER — HYDROCODONE-ACETAMINOPHEN 5-325 MG PO TABS: ORAL_TABLET | ORAL | 0 refills | Status: DC

## 2020-06-22 NOTE — Progress Notes (Signed)
PROCEDURE NOTE:  The patient request injection, verbal consent was obtained.  The right shoulder was prepped appropriately after time out was performed.   Sterile technique was observed and injection of 1 cc of Celestone 6mg  with several cc's of plain xylocaine. Anesthesia was provided by ethyl chloride and a 20-gauge needle was used to inject the shoulder area. A posterior approach was used.  The injection was tolerated well.  A band aid dressing was applied.  The patient was advised to apply ice later today and tomorrow to the injection sight as needed.   Return in one month.  I have reviewed the Controlled Substance Reporting System web site prior to prescribing narcotic medicine for this patient.  Call if any problem.  Precautions discussed.  Electronically Signed [place], MD 6/22/20229:12 AM

## 2020-07-07 ENCOUNTER — Other Ambulatory Visit: Payer: Self-pay | Admitting: Orthopaedic Surgery

## 2020-07-07 NOTE — Telephone Encounter (Signed)
Patient relays that for another provider other than Dr Hilda Lias ordering her refill, she would need authorization. States she will wait for Dr Hilda Lias to return next week.

## 2020-07-07 NOTE — Telephone Encounter (Signed)
Patient also called regarding this refill. States [facility] in [place] has not received response from electronic fax. I relayed Dr Hilda Lias is out of clinic this week. Routing to Engineer, manufacturing to review with provider(s) in clinc.

## 2020-07-13 ENCOUNTER — Telehealth: Payer: Self-pay | Admitting: Orthopaedic Surgery

## 2020-07-13 NOTE — Telephone Encounter (Signed)
Patient states since her insurance would not cover; therefore, she did not ever pick up the prescription. Per her discussing with pharmacist at [facility], Betty Lutz, she requests that Dr Betty Lutz cancel the prescription he had ordered so that she may have it re-ordered by Dr Betty Lutz. Please advise.

## 2020-07-13 NOTE — Telephone Encounter (Signed)
Patient requests Hydrocodone/Acetaminophen 5-325 mgs.  Qty  20   Sig: TAKE ONE TABLET BY MOUTH EVERY 6 HOURS AS NEEDED FOR PAIN   Patient uses [facility] in [place]

## 2020-07-14 MED ORDER — HYDROCODONE-ACETAMINOPHEN 5-325 MG PO TABS: 1.0000 | ORAL_TABLET | Freq: Four times a day (QID) | ORAL | 0 refills | Status: DC | PRN

## 2020-07-20 ENCOUNTER — Ambulatory Visit: Payer: Medicaid Other | Admitting: Orthopaedic Surgery

## 2020-07-29 ENCOUNTER — Telehealth: Payer: Self-pay | Admitting: Orthopaedic Surgery

## 2020-07-29 NOTE — Telephone Encounter (Signed)
Patient requests Hydrocodone/Acetaminophen 5-325 mgs.  Qty  20   Sig: TAKE ONE TABLET BY MOUTH EVERY 6 HOURS AS NEEDED FOR PAIN   Patient uses [facility] in [place] 

## 2020-08-01 MED ORDER — HYDROCODONE-ACETAMINOPHEN 5-325 MG PO TABS: 1.0000 | ORAL_TABLET | Freq: Four times a day (QID) | ORAL | 0 refills | Status: DC | PRN

## 2020-08-03 ENCOUNTER — Other Ambulatory Visit: Payer: Self-pay

## 2020-08-03 ENCOUNTER — Encounter: Payer: Self-pay | Admitting: Orthopaedic Surgery

## 2020-08-03 ENCOUNTER — Ambulatory Visit (INDEPENDENT_AMBULATORY_CARE_PROVIDER_SITE_OTHER): Payer: Medicaid Other | Admitting: Orthopaedic Surgery

## 2020-08-03 DIAGNOSIS — G8929 Other chronic pain: Secondary | ICD-10-CM | POA: Diagnosis not present

## 2020-08-03 DIAGNOSIS — F17210 Nicotine dependence, cigarettes, uncomplicated: Secondary | ICD-10-CM

## 2020-08-03 DIAGNOSIS — M25511 Pain in right shoulder: Secondary | ICD-10-CM

## 2020-08-03 NOTE — Progress Notes (Signed)
PROCEDURE NOTE:  The patient request injection, verbal consent was obtained.  The right shoulder was prepped appropriately after time out was performed.   Sterile technique was observed and injection of 1 cc of Celestone 6 mg with several cc's of plain xylocaine. Anesthesia was provided by ethyl chloride and a 20-gauge needle was used to inject the shoulder area. A posterior approach was used.  The injection was tolerated well.  A band aid dressing was applied.  The patient was advised to apply ice later today and tomorrow to the injection sight as needed.   Return in two months.  Call if any problem.  Precautions discussed.  Electronically Signed Darreld Mclean, MD 8/3/20229:11 AM

## 2020-08-16 ENCOUNTER — Telehealth: Payer: Self-pay | Admitting: Orthopaedic Surgery

## 2020-08-29 ENCOUNTER — Telehealth: Payer: Self-pay | Admitting: Orthopaedic Surgery

## 2020-08-29 NOTE — Telephone Encounter (Signed)
Patient requests refill: HYDROcodone-acetaminophen (NORCO/VICODIN) 5-325 MG tablet 18 tablet   [facility]

## 2020-08-29 NOTE — Telephone Encounter (Signed)
Done

## 2020-08-30 NOTE — Telephone Encounter (Signed)
Patient made aware.

## 2020-09-14 ENCOUNTER — Telehealth: Payer: Self-pay | Admitting: Orthopaedic Surgery

## 2020-09-28 ENCOUNTER — Ambulatory Visit (INDEPENDENT_AMBULATORY_CARE_PROVIDER_SITE_OTHER): Payer: Medicaid Other | Admitting: Orthopaedic Surgery

## 2020-09-28 ENCOUNTER — Encounter: Payer: Self-pay | Admitting: Orthopaedic Surgery

## 2020-09-28 DIAGNOSIS — G8929 Other chronic pain: Secondary | ICD-10-CM | POA: Diagnosis not present

## 2020-09-28 DIAGNOSIS — M25511 Pain in right shoulder: Secondary | ICD-10-CM | POA: Diagnosis not present

## 2020-09-28 DIAGNOSIS — F17210 Nicotine dependence, cigarettes, uncomplicated: Secondary | ICD-10-CM

## 2020-09-28 MED ORDER — HYDROCODONE-ACETAMINOPHEN 5-325 MG PO TABS: 1.0000 | ORAL_TABLET | Freq: Four times a day (QID) | ORAL | 0 refills | Status: DC | PRN

## 2020-09-28 NOTE — Progress Notes (Signed)
PROCEDURE NOTE:  The patient request injection, verbal consent was obtained.  The right shoulder was prepped appropriately after time out was performed.   Sterile technique was observed and injection of 1 cc of Celestone 6 mg with several cc's of plain xylocaine. Anesthesia was provided by ethyl chloride and a 20-gauge needle was used to inject the shoulder area. A posterior approach was used.  The injection was tolerated well.  A band aid dressing was applied.  The patient was advised to apply ice later today and tomorrow to the injection sight as needed.   I have reviewed the [place] Controlled Substance Reporting System web site prior to prescribing narcotic medicine for this patient.  Encounter Diagnoses  Name Primary?   Chronic right shoulder pain Yes   Nicotine dependence, cigarettes, uncomplicated    Return in two months.  Call if any problem.  Precautions discussed.  Electronically Signed Darreld Mclean, MD 9/28/20229:04 AM

## 2020-10-12 ENCOUNTER — Telehealth: Payer: Self-pay | Admitting: Orthopaedic Surgery

## 2020-10-12 NOTE — Telephone Encounter (Signed)
Patient called requesting refill for her  pain medicine.     HYDROcodone-acetaminophen (NORCO/VICODIN) 5-325 MG tablet  Pharmacy: [facility] in [place]

## 2020-10-27 ENCOUNTER — Other Ambulatory Visit: Payer: Self-pay | Admitting: Orthopaedic Surgery

## 2020-11-01 ENCOUNTER — Telehealth: Payer: Self-pay | Admitting: Radiology

## 2020-11-01 MED ORDER — HYDROCODONE-ACETAMINOPHEN 5-325 MG PO TABS: 1.0000 | ORAL_TABLET | Freq: Four times a day (QID) | ORAL | 0 refills | Status: DC | PRN

## 2020-11-01 NOTE — Telephone Encounter (Signed)
Patient called and said that her insurance will not let Dr Romeo Apple be the one who prescribes her pain medication, Dr Hilda Lias is locked in as provider that has to provide the medication.     Patient asking for a refill hydrocodone.  [facility].

## 2020-11-15 ENCOUNTER — Telehealth: Payer: Self-pay | Admitting: Orthopaedic Surgery

## 2020-11-23 ENCOUNTER — Encounter: Payer: Self-pay | Admitting: Orthopaedic Surgery

## 2020-11-23 ENCOUNTER — Ambulatory Visit (INDEPENDENT_AMBULATORY_CARE_PROVIDER_SITE_OTHER): Payer: Medicaid Other | Admitting: Orthopaedic Surgery

## 2020-11-23 DIAGNOSIS — M1A09X0 Idiopathic chronic gout, multiple sites, without tophus (tophi): Secondary | ICD-10-CM | POA: Diagnosis not present

## 2020-11-23 MED ORDER — PREDNISONE 10 MG (21) PO TBPK: ORAL_TABLET | ORAL | 1 refills | Status: DC

## 2020-11-23 NOTE — Progress Notes (Signed)
Virtual Visit via Telephone Note  I connected withNAME@ on 11/23/20 at  9:00 AM EST by telephone and verified that I am speaking with the correct person using two identifiers.  Location: Patient: home  Provider: [facility]   I discussed the limitations, risks, security and privacy concerns of performing an evaluation and management service by telephone and the availability of in person appointments. I also discussed with the patient that there may be a patient responsible charge related to this service. The patient expressed understanding and agreed to proceed.   History of Present Illness: She has flare up of gout in the right foot.  She is taking her allopurinol.  She is tender.  She has pain medicine.  I will call in prednisone dose pack.  Continue the allopurinol.   Observations/Objective: Per above.  Assessment and Plan: gout   Follow Up Instructions: Get the medicine.  I will see in one month.  If get worse, let me know   I discussed the assessment and treatment plan with the patient. The patient was provided an opportunity to ask questions and all were answered. The patient agreed with the plan and demonstrated an understanding of the instructions.   The patient was advised to call back or seek an in-person evaluation if the symptoms worsen or if the condition fails to improve as anticipated.  I provided 8 minutes of non-face-to-face time during this encounter.   Darreld Mclean, MD

## 2020-11-29 ENCOUNTER — Telehealth: Payer: Self-pay | Admitting: Orthopaedic Surgery

## 2020-12-13 ENCOUNTER — Telehealth: Payer: Self-pay | Admitting: Orthopaedic Surgery

## 2020-12-21 ENCOUNTER — Ambulatory Visit (INDEPENDENT_AMBULATORY_CARE_PROVIDER_SITE_OTHER): Payer: Medicaid Other | Admitting: Orthopaedic Surgery

## 2020-12-21 ENCOUNTER — Encounter: Payer: Self-pay | Admitting: Orthopaedic Surgery

## 2020-12-21 VITALS — Ht 67.0 in | Wt 243.2 lb

## 2020-12-21 DIAGNOSIS — G8929 Other chronic pain: Secondary | ICD-10-CM | POA: Diagnosis not present

## 2020-12-21 DIAGNOSIS — M25511 Pain in right shoulder: Secondary | ICD-10-CM

## 2020-12-21 DIAGNOSIS — F17210 Nicotine dependence, cigarettes, uncomplicated: Secondary | ICD-10-CM

## 2020-12-21 NOTE — Patient Instructions (Signed)

## 2020-12-21 NOTE — Progress Notes (Signed)
PROCEDURE NOTE:  The patient request injection, verbal consent was obtained.  The right shoulder was prepped appropriately after time out was performed.   Sterile technique was observed and injection of 1 cc of DepoMedrol 40mg  with several cc's of plain xylocaine. Anesthesia was provided by ethyl chloride and a 20-gauge needle was used to inject the shoulder area. A posterior approach was used.  The injection was tolerated well.  A band aid dressing was applied.  The patient was advised to apply ice later today and tomorrow to the injection sight as needed.   Encounter Diagnoses  Name Primary?   Chronic right shoulder pain Yes   Nicotine dependence, cigarettes, uncomplicated    Return prn.  Call if any problem.  Precautions discussed.  Electronically Signed , MD 12/21/20229:08 AM

## 2021-01-03 ENCOUNTER — Telehealth: Payer: Self-pay | Admitting: Orthopaedic Surgery

## 2021-01-17 ENCOUNTER — Telehealth: Payer: Self-pay | Admitting: Radiology

## 2021-01-17 ENCOUNTER — Telehealth: Payer: Self-pay | Admitting: Orthopaedic Surgery

## 2021-01-17 NOTE — Telephone Encounter (Signed)
Patient called and said her shoulder was hurting a lot more.  She had an injection with you in [place] 12/212022- wants to know when you will be able to do an injection again for her.

## 2021-02-01 ENCOUNTER — Ambulatory Visit (INDEPENDENT_AMBULATORY_CARE_PROVIDER_SITE_OTHER): Payer: Medicaid Other | Admitting: Orthopaedic Surgery

## 2021-02-01 ENCOUNTER — Telehealth: Payer: Self-pay | Admitting: Orthopaedic Surgery

## 2021-02-01 ENCOUNTER — Encounter: Payer: Self-pay | Admitting: Orthopaedic Surgery

## 2021-02-01 ENCOUNTER — Other Ambulatory Visit: Payer: Self-pay

## 2021-02-01 DIAGNOSIS — M25511 Pain in right shoulder: Secondary | ICD-10-CM | POA: Diagnosis not present

## 2021-02-01 DIAGNOSIS — G8929 Other chronic pain: Secondary | ICD-10-CM

## 2021-02-01 DIAGNOSIS — F17210 Nicotine dependence, cigarettes, uncomplicated: Secondary | ICD-10-CM

## 2021-02-01 MED ORDER — HYDROCODONE-ACETAMINOPHEN 5-325 MG PO TABS: ORAL_TABLET | ORAL | 0 refills | Status: DC

## 2021-02-01 NOTE — Telephone Encounter (Signed)
Patient states her pharmacy ([facility] said that for this prescription for 7 day supply, prior auth is needed;  States that it was not needed for 5 day supply previously HYDROcodone-acetaminophen (NORCO/VICODIN) 5-325 MG tablet

## 2021-02-01 NOTE — Addendum Note (Signed)
Addended by: Earnstine Regal on: 02/01/2021 11:12 AM   Modules accepted: Orders

## 2021-02-01 NOTE — Progress Notes (Signed)
PROCEDURE NOTE:  The patient request injection, verbal consent was obtained.  The right shoulder was prepped appropriately after time out was performed.   Sterile technique was observed and injection of 1 cc of DepoMedrol 40mg  with several cc's of plain xylocaine. Anesthesia was provided by ethyl chloride and a 20-gauge needle was used to inject the shoulder area. A posterior approach was used.  The injection was tolerated well.  A band aid dressing was applied.  The patient was advised to apply ice later today and tomorrow to the injection sight as needed.  Encounter Diagnoses  Name Primary?   Chronic right shoulder pain Yes   Nicotine dependence, cigarettes, uncomplicated    Return prn  I have reviewed the Controlled Substance Reporting System web site prior to prescribing narcotic medicine for this patient.  Call if any problem.  Precautions discussed.  Electronically Signed [place], MD 2/1/20239:04 AM

## 2021-02-28 ENCOUNTER — Other Ambulatory Visit: Payer: Self-pay | Admitting: Orthopaedic Surgery

## 2021-03-07 ENCOUNTER — Telehealth: Payer: Self-pay

## 2021-03-07 NOTE — Telephone Encounter (Signed)
Patient states she did get the medication last week. Stated that she gets 20 tablets limit 5 day supply ? ? ?

## 2021-03-07 NOTE — Telephone Encounter (Signed)
Hydrocodone-Acetaminophen 5/325 MG   ? ?PATIENT USES [facility] IN [place] ?

## 2021-03-15 ENCOUNTER — Other Ambulatory Visit: Payer: Self-pay

## 2021-03-15 ENCOUNTER — Encounter: Payer: Self-pay | Admitting: Orthopaedic Surgery

## 2021-03-15 ENCOUNTER — Ambulatory Visit: Payer: Medicaid Other | Admitting: Orthopaedic Surgery

## 2021-03-15 VITALS — Ht 67.0 in | Wt 238.0 lb

## 2021-03-15 DIAGNOSIS — G8929 Other chronic pain: Secondary | ICD-10-CM

## 2021-03-15 DIAGNOSIS — M25511 Pain in right shoulder: Secondary | ICD-10-CM | POA: Diagnosis not present

## 2021-03-15 DIAGNOSIS — F17210 Nicotine dependence, cigarettes, uncomplicated: Secondary | ICD-10-CM

## 2021-03-15 MED ORDER — HYDROCODONE-ACETAMINOPHEN 5-325 MG PO TABS
ORAL_TABLET | ORAL | 0 refills | Status: DC
Start: 2021-03-15 — End: 2021-03-28

## 2021-03-15 NOTE — Progress Notes (Signed)
PROCEDURE NOTE: ? ?The patient request injection, verbal consent was obtained. ? ?The right shoulder was prepped appropriately after time out was performed.  ? ?Sterile technique was observed and injection of 1 cc of DepoMedrol 40mg  with several cc's of plain xylocaine. Anesthesia was provided by ethyl chloride and a 20-gauge needle was used to inject the shoulder area. A posterior approach was used.  The injection was tolerated well. ? ?A band aid dressing was applied. ? ?The patient was advised to apply ice later today and tomorrow to the injection sight as needed. ?  ?Encounter Diagnoses  ?Name Primary?  ? Chronic right shoulder pain Yes  ? Nicotine dependence, cigarettes, uncomplicated   ? ?I have reviewed the Controlled Substance Reporting System web site prior to prescribing narcotic medicine for this patient. ? ?Return in one month. ? ?Call if any problem. ? ?Precautions discussed. ? ?Electronically Signed ?[place], MD ?3/15/20239:47 AM ? ?

## 2021-03-15 NOTE — Patient Instructions (Signed)

## 2021-03-28 ENCOUNTER — Telehealth: Payer: Self-pay | Admitting: Orthopaedic Surgery

## 2021-04-12 ENCOUNTER — Ambulatory Visit (INDEPENDENT_AMBULATORY_CARE_PROVIDER_SITE_OTHER): Payer: Medicaid Other | Admitting: Orthopaedic Surgery

## 2021-04-12 ENCOUNTER — Encounter: Payer: Self-pay | Admitting: Orthopaedic Surgery

## 2021-04-12 VITALS — Ht 67.0 in | Wt 236.0 lb

## 2021-04-12 DIAGNOSIS — G8929 Other chronic pain: Secondary | ICD-10-CM | POA: Diagnosis not present

## 2021-04-12 DIAGNOSIS — M25511 Pain in right shoulder: Secondary | ICD-10-CM

## 2021-04-12 DIAGNOSIS — F17210 Nicotine dependence, cigarettes, uncomplicated: Secondary | ICD-10-CM

## 2021-04-12 MED ORDER — HYDROCODONE-ACETAMINOPHEN 5-325 MG PO TABS: ORAL_TABLET | ORAL | 0 refills | Status: DC

## 2021-04-12 NOTE — Progress Notes (Signed)
PROCEDURE NOTE: ? ?The patient request injection, verbal consent was obtained. ? ?The right shoulder was prepped appropriately after time out was performed.  ? ?Sterile technique was observed and injection of 1 cc of DepoMedrol 40mg  with several cc's of plain xylocaine. Anesthesia was provided by ethyl chloride and a 20-gauge needle was used to inject the shoulder area. A posterior approach was used.  The injection was tolerated well. ? ?A band aid dressing was applied. ? ?The patient was advised to apply ice later today and tomorrow to the injection sight as needed. ? ?Encounter Diagnoses  ?Name Primary?  ? Chronic right shoulder pain Yes  ? Nicotine dependence, cigarettes, uncomplicated   ? ?I have reviewed the Controlled Substance Reporting System web site prior to prescribing narcotic medicine for this patient. ? ?Return in one month. ? ?Call if any problem. ? ?Precautions discussed. ? ?Electronically Signed ?[place], MD ?4/12/20238:59 AM ? ?

## 2021-04-25 ENCOUNTER — Telehealth: Payer: Self-pay | Admitting: Orthopaedic Surgery

## 2021-05-10 ENCOUNTER — Encounter: Payer: Self-pay | Admitting: Orthopaedic Surgery

## 2021-05-10 ENCOUNTER — Ambulatory Visit (INDEPENDENT_AMBULATORY_CARE_PROVIDER_SITE_OTHER): Payer: Medicaid Other | Admitting: Orthopaedic Surgery

## 2021-05-10 VITALS — Wt 226.0 lb

## 2021-05-10 DIAGNOSIS — M25511 Pain in right shoulder: Secondary | ICD-10-CM | POA: Diagnosis not present

## 2021-05-10 DIAGNOSIS — G8929 Other chronic pain: Secondary | ICD-10-CM

## 2021-05-10 DIAGNOSIS — F17210 Nicotine dependence, cigarettes, uncomplicated: Secondary | ICD-10-CM

## 2021-05-10 MED ORDER — HYDROCODONE-ACETAMINOPHEN 5-325 MG PO TABS: ORAL_TABLET | ORAL | 0 refills | Status: DC

## 2021-05-10 NOTE — Progress Notes (Signed)
PROCEDURE NOTE: ? ?The patient request injection, verbal consent was obtained. ? ?The right shoulder was prepped appropriately after time out was performed.  ? ?Sterile technique was observed and injection of 1 cc of DepoMedrol 40mg  with several cc's of plain xylocaine. Anesthesia was provided by ethyl chloride and a 20-gauge needle was used to inject the shoulder area. A posterior approach was used.  The injection was tolerated well. ? ?A band aid dressing was applied. ? ?The patient was advised to apply ice later today and tomorrow to the injection sight as needed. ? ?Encounter Diagnoses  ?Name Primary?  ? Chronic right shoulder pain Yes  ? Nicotine dependence, cigarettes, uncomplicated   ? ?Return in one month. ? ?I have reviewed the Controlled Substance Reporting System web site prior to prescribing narcotic medicine for this patient. ? ?Call if any problem. ? ?Precautions discussed. ? ?Electronically Signed ?[place], MD ?5/10/20239:06 AM ? ? ?

## 2021-06-07 ENCOUNTER — Ambulatory Visit: Payer: Medicaid Other | Admitting: Orthopaedic Surgery

## 2021-06-21 ENCOUNTER — Encounter: Payer: Self-pay | Admitting: Orthopaedic Surgery

## 2021-06-21 ENCOUNTER — Ambulatory Visit: Payer: Medicaid Other | Admitting: Orthopaedic Surgery

## 2021-06-21 DIAGNOSIS — M25511 Pain in right shoulder: Secondary | ICD-10-CM

## 2021-06-21 DIAGNOSIS — G8929 Other chronic pain: Secondary | ICD-10-CM | POA: Diagnosis not present

## 2021-06-21 DIAGNOSIS — F17210 Nicotine dependence, cigarettes, uncomplicated: Secondary | ICD-10-CM

## 2021-06-21 MED ORDER — HYDROCODONE-ACETAMINOPHEN 5-325 MG PO TABS: ORAL_TABLET | ORAL | 0 refills | Status: DC

## 2021-06-21 NOTE — Progress Notes (Signed)
PROCEDURE NOTE:  The patient request injection, verbal consent was obtained.  The right shoulder was prepped appropriately after time out was performed.   Sterile technique was observed and injection of 1 cc of DepoMedrol 40mg  with several cc's of plain xylocaine. Anesthesia was provided by ethyl chloride and a 20-gauge needle was used to inject the shoulder area. A posterior approach was used.  The injection was tolerated well.  A band aid dressing was applied.  The patient was advised to apply ice later today and tomorrow to the injection sight as needed.  Encounter Diagnoses  Name Primary?   Chronic right shoulder pain Yes   Nicotine dependence, cigarettes, uncomplicated    I have reviewed the Controlled Substance Reporting System web site prior to prescribing narcotic medicine for this patient.  Return in one month.  Call if any problem.  Precautions discussed.  Electronically Signed [place], MD 6/21/20239:20 AM

## 2021-07-11 ENCOUNTER — Telehealth: Payer: Self-pay | Admitting: Orthopaedic Surgery

## 2021-07-19 ENCOUNTER — Ambulatory Visit: Payer: Medicaid Other | Admitting: Orthopaedic Surgery

## 2021-07-26 ENCOUNTER — Ambulatory Visit: Payer: Medicaid Other | Admitting: Orthopaedic Surgery

## 2021-08-09 ENCOUNTER — Encounter: Payer: Self-pay | Admitting: Orthopaedic Surgery

## 2021-08-09 ENCOUNTER — Ambulatory Visit (INDEPENDENT_AMBULATORY_CARE_PROVIDER_SITE_OTHER): Payer: Medicaid Other | Admitting: Orthopaedic Surgery

## 2021-08-09 DIAGNOSIS — G8929 Other chronic pain: Secondary | ICD-10-CM | POA: Diagnosis not present

## 2021-08-09 DIAGNOSIS — M25511 Pain in right shoulder: Secondary | ICD-10-CM

## 2021-08-09 DIAGNOSIS — F17210 Nicotine dependence, cigarettes, uncomplicated: Secondary | ICD-10-CM

## 2021-08-09 MED ORDER — HYDROCODONE-ACETAMINOPHEN 5-325 MG PO TABS: ORAL_TABLET | ORAL | 0 refills | Status: DC

## 2021-08-09 NOTE — Progress Notes (Signed)
PROCEDURE NOTE:  The patient request injection, verbal consent was obtained.  The right shoulder was prepped appropriately after time out was performed.   Sterile technique was observed and injection of 1 cc of DepoMedrol 40mg  with several cc's of plain xylocaine. Anesthesia was provided by ethyl chloride and a 20-gauge needle was used to inject the shoulder area. A posterior approach was used.  The injection was tolerated well.  A band aid dressing was applied.  The patient was advised to apply ice later today and tomorrow to the injection sight as needed.  Encounter Diagnoses  Name Primary?   Chronic right shoulder pain Yes   Nicotine dependence, cigarettes, uncomplicated    Return in one month.  I have reviewed the Controlled Substance Reporting System web site prior to prescribing narcotic medicine for this patient.  Call if any problem.  Precautions discussed.  Electronically Signed [place], MD 8/9/20239:30 AM

## 2021-08-29 ENCOUNTER — Telehealth: Payer: Self-pay | Admitting: Orthopaedic Surgery

## 2021-09-06 ENCOUNTER — Ambulatory Visit: Payer: Medicaid Other | Admitting: Orthopaedic Surgery

## 2021-09-13 ENCOUNTER — Ambulatory Visit (INDEPENDENT_AMBULATORY_CARE_PROVIDER_SITE_OTHER): Payer: Medicaid Other | Admitting: Orthopaedic Surgery

## 2021-09-13 ENCOUNTER — Encounter: Payer: Self-pay | Admitting: Orthopaedic Surgery

## 2021-09-13 DIAGNOSIS — M25511 Pain in right shoulder: Secondary | ICD-10-CM

## 2021-09-13 DIAGNOSIS — F17210 Nicotine dependence, cigarettes, uncomplicated: Secondary | ICD-10-CM

## 2021-09-13 DIAGNOSIS — G8929 Other chronic pain: Secondary | ICD-10-CM | POA: Diagnosis not present

## 2021-09-13 MED ORDER — METHYLPREDNISOLONE ACETATE 40 MG/ML IJ SUSP
40.0000 mg | Freq: Once | INTRAMUSCULAR | Status: AC
  Administered 2021-09-13: 40 mg via INTRA_ARTICULAR

## 2021-09-13 MED ORDER — HYDROCODONE-ACETAMINOPHEN 5-325 MG PO TABS: 1.0000 | ORAL_TABLET | Freq: Four times a day (QID) | ORAL | 0 refills | Status: DC | PRN

## 2021-09-13 NOTE — Progress Notes (Signed)
PROCEDURE NOTE:  The patient request injection, verbal consent was obtained.  The right shoulder was prepped appropriately after time out was performed.   Sterile technique was observed and injection of 1 cc of DepoMedrol 40mg  with several cc's of plain xylocaine. Anesthesia was provided by ethyl chloride and a 20-gauge needle was used to inject the shoulder area. A posterior approach was used.  The injection was tolerated well.  A band aid dressing was applied.  The patient was advised to apply ice later today and tomorrow to the injection sight as needed.  Encounter Diagnoses  Name Primary?   Chronic right shoulder pain Yes   Nicotine dependence, cigarettes, uncomplicated    Return in five weeks.  I refilled her pain medicine.  Call if any problem.  Precautions discussed.  Electronically Signed , MD 9/13/202310:24 AM

## 2021-09-13 NOTE — Addendum Note (Signed)
Addended by: Recardo Evangelist A on: 09/13/2021 10:40 AM   Modules accepted: Orders

## 2021-10-03 ENCOUNTER — Telehealth: Payer: Self-pay | Admitting: Orthopaedic Surgery

## 2021-10-18 ENCOUNTER — Encounter: Payer: Self-pay | Admitting: Orthopaedic Surgery

## 2021-10-18 ENCOUNTER — Ambulatory Visit (INDEPENDENT_AMBULATORY_CARE_PROVIDER_SITE_OTHER): Payer: Medicaid Other | Admitting: Orthopaedic Surgery

## 2021-10-18 DIAGNOSIS — F17210 Nicotine dependence, cigarettes, uncomplicated: Secondary | ICD-10-CM

## 2021-10-18 DIAGNOSIS — M25511 Pain in right shoulder: Secondary | ICD-10-CM | POA: Diagnosis not present

## 2021-10-18 DIAGNOSIS — M1A09X0 Idiopathic chronic gout, multiple sites, without tophus (tophi): Secondary | ICD-10-CM

## 2021-10-18 DIAGNOSIS — G8929 Other chronic pain: Secondary | ICD-10-CM

## 2021-10-18 MED ORDER — HYDROCODONE-ACETAMINOPHEN 5-325 MG PO TABS
1.0000 | ORAL_TABLET | Freq: Four times a day (QID) | ORAL | 0 refills | Status: DC | PRN
Start: 2021-10-18 — End: 2021-11-01

## 2021-10-18 MED ORDER — ALLOPURINOL 300 MG PO TABS: 300.0000 mg | ORAL_TABLET | Freq: Every day | ORAL | 5 refills | Status: DC

## 2021-10-18 MED ORDER — METHYLPREDNISOLONE ACETATE 40 MG/ML IJ SUSP
40.0000 mg | Freq: Once | INTRAMUSCULAR | Status: AC
  Administered 2021-10-18: 40 mg via INTRA_ARTICULAR

## 2021-10-18 NOTE — Addendum Note (Signed)
Addended by: Obie Dredge A on: 10/18/2021 09:02 AM   Modules accepted: Orders

## 2021-10-18 NOTE — Progress Notes (Signed)
PROCEDURE NOTE:  The patient request injection, verbal consent was obtained.  The right shoulder was prepped appropriately after time out was performed.   Sterile technique was observed and injection of 1 cc of DepoMedrol 40mg  with several cc's of plain xylocaine. Anesthesia was provided by ethyl chloride and a 20-gauge needle was used to inject the shoulder area. A posterior approach was used.  The injection was tolerated well.  A band aid dressing was applied.  The patient was advised to apply ice later today and tomorrow to the injection sight as needed.  Encounter Diagnoses  Name Primary?   Chronic right shoulder pain Yes   Nicotine dependence, cigarettes, uncomplicated    Idiopathic chronic gout of multiple sites without tophus    I will refill her allopurinol.  I will refill pain medicine.  Return in five weeks.  Call if any problem.  Precautions discussed.  Electronically Signed Sanjuana Kava, MD 10/18/20238:22 AM

## 2021-11-01 ENCOUNTER — Telehealth: Payer: Self-pay | Admitting: Orthopaedic Surgery

## 2021-11-22 ENCOUNTER — Ambulatory Visit: Payer: Medicaid Other | Admitting: Orthopaedic Surgery

## 2021-12-05 ENCOUNTER — Ambulatory Visit: Payer: Medicaid Other | Admitting: Orthopaedic Surgery

## 2021-12-13 ENCOUNTER — Telehealth: Payer: Self-pay | Admitting: Orthopaedic Surgery

## 2021-12-13 ENCOUNTER — Ambulatory Visit: Payer: Medicaid Other | Admitting: Orthopaedic Surgery

## 2021-12-20 ENCOUNTER — Ambulatory Visit (INDEPENDENT_AMBULATORY_CARE_PROVIDER_SITE_OTHER): Payer: Medicaid Other | Admitting: Orthopaedic Surgery

## 2021-12-20 ENCOUNTER — Encounter: Payer: Self-pay | Admitting: Orthopaedic Surgery

## 2021-12-20 VITALS — Ht 67.0 in | Wt 236.0 lb

## 2021-12-20 DIAGNOSIS — F17210 Nicotine dependence, cigarettes, uncomplicated: Secondary | ICD-10-CM

## 2021-12-20 DIAGNOSIS — G8929 Other chronic pain: Secondary | ICD-10-CM

## 2021-12-20 DIAGNOSIS — M25511 Pain in right shoulder: Secondary | ICD-10-CM

## 2021-12-20 MED ORDER — METHYLPREDNISOLONE ACETATE 40 MG/ML IJ SUSP
40.0000 mg | Freq: Once | INTRAMUSCULAR | Status: AC
  Administered 2021-12-20: 40 mg via INTRA_ARTICULAR

## 2021-12-20 NOTE — Addendum Note (Signed)
Addended by: Recardo Evangelist A on: 12/20/2021 08:49 AM   Modules accepted: Orders

## 2021-12-20 NOTE — Progress Notes (Signed)
PROCEDURE NOTE:  The patient request injection, verbal consent was obtained.  The right shoulder was prepped appropriately after time out was performed.   Sterile technique was observed and injection of 1 cc of DepoMedrol 40mg  with several cc's of plain xylocaine. Anesthesia was provided by ethyl chloride and a 20-gauge needle was used to inject the shoulder area. A posterior approach was used.  The injection was tolerated well.  A band aid dressing was applied.  The patient was advised to apply ice later today and tomorrow to the injection sight as needed.  Encounter Diagnoses  Name Primary?   Chronic right shoulder pain Yes   Nicotine dependence, cigarettes, uncomplicated    Return in five weeks.  Call if any problem.  Precautions discussed.  Electronically Signed , MD 12/20/20238:45 AM

## 2022-01-02 ENCOUNTER — Telehealth: Payer: Self-pay | Admitting: Orthopaedic Surgery

## 2022-01-16 ENCOUNTER — Telehealth: Payer: Self-pay | Admitting: Orthopaedic Surgery

## 2022-01-24 ENCOUNTER — Ambulatory Visit (INDEPENDENT_AMBULATORY_CARE_PROVIDER_SITE_OTHER): Payer: Medicaid Other | Admitting: Orthopaedic Surgery

## 2022-01-24 ENCOUNTER — Encounter: Payer: Self-pay | Admitting: Orthopaedic Surgery

## 2022-01-24 VITALS — Ht 67.0 in | Wt 226.0 lb

## 2022-01-24 DIAGNOSIS — G8929 Other chronic pain: Secondary | ICD-10-CM | POA: Diagnosis not present

## 2022-01-24 DIAGNOSIS — M25511 Pain in right shoulder: Secondary | ICD-10-CM

## 2022-01-24 DIAGNOSIS — F17210 Nicotine dependence, cigarettes, uncomplicated: Secondary | ICD-10-CM

## 2022-01-24 MED ORDER — METHYLPREDNISOLONE ACETATE 40 MG/ML IJ SUSP
40.0000 mg | Freq: Once | INTRAMUSCULAR | Status: AC
  Administered 2022-01-24: 40 mg via INTRA_ARTICULAR

## 2022-01-24 NOTE — Addendum Note (Signed)
Addended by: Obie Dredge A on: 01/24/2022 08:55 AM   Modules accepted: Orders

## 2022-01-24 NOTE — Progress Notes (Signed)
PROCEDURE NOTE:  The patient request injection, verbal consent was obtained.  The right shoulder was prepped appropriately after time out was performed.   Sterile technique was observed and injection of 1 cc of DepoMedrol 40mg  with several cc's of plain xylocaine. Anesthesia was provided by ethyl chloride and a 20-gauge needle was used to inject the shoulder area. A posterior approach was used.  The injection was tolerated well.  A band aid dressing was applied.  The patient was advised to apply ice later today and tomorrow to the injection sight as needed.  Encounter Diagnoses  Name Primary?   Chronic right shoulder pain Yes   Nicotine dependence, cigarettes, uncomplicated    Return in six weeks.  Call if any problem.  Precautions discussed.  Electronically [place], MD 1/24/20248:45 AM

## 2022-01-30 ENCOUNTER — Telehealth: Payer: Self-pay | Admitting: Orthopaedic Surgery

## 2022-02-21 ENCOUNTER — Telehealth: Payer: Self-pay | Admitting: Orthopaedic Surgery

## 2022-03-07 ENCOUNTER — Ambulatory Visit (INDEPENDENT_AMBULATORY_CARE_PROVIDER_SITE_OTHER): Payer: Medicaid Other | Admitting: Orthopaedic Surgery

## 2022-03-07 ENCOUNTER — Encounter: Payer: Self-pay | Admitting: Orthopaedic Surgery

## 2022-03-07 DIAGNOSIS — F17210 Nicotine dependence, cigarettes, uncomplicated: Secondary | ICD-10-CM

## 2022-03-07 DIAGNOSIS — M25511 Pain in right shoulder: Secondary | ICD-10-CM

## 2022-03-07 DIAGNOSIS — G8929 Other chronic pain: Secondary | ICD-10-CM | POA: Diagnosis not present

## 2022-03-07 MED ORDER — HYDROCODONE-ACETAMINOPHEN 5-325 MG PO TABS: 1.0000 | ORAL_TABLET | Freq: Four times a day (QID) | ORAL | 0 refills | Status: DC | PRN

## 2022-03-07 MED ORDER — CYCLOBENZAPRINE HCL 10 MG PO TABS: 10.0000 mg | ORAL_TABLET | Freq: Every day | ORAL | 0 refills | Status: AC

## 2022-03-07 NOTE — Progress Notes (Signed)
PROCEDURE NOTE:  The patient request injection, verbal consent was obtained.  The right shoulder was prepped appropriately after time out was performed.   Sterile technique was observed and injection of 1 cc of DepoMedrol '40mg'$  with several cc's of plain xylocaine. Anesthesia was provided by ethyl chloride and a 20-gauge needle was used to inject the shoulder area. A posterior approach was used.  The injection was tolerated well.  A band aid dressing was applied.  The patient was advised to apply ice later today and tomorrow to the injection sight as needed.  Encounter Diagnoses  Name Primary?   Chronic right shoulder pain Yes   Nicotine dependence, cigarettes, uncomplicated    I have refilled pain medicine.  I will add Flexeril for night use.  Return in six weeks.  Call if any problem.  Precautions discussed.  Electronically Signed Sanjuana Kava, MD 3/6/20248:38 AM

## 2022-03-20 ENCOUNTER — Other Ambulatory Visit: Payer: Self-pay | Admitting: Orthopaedic Surgery

## 2022-03-20 ENCOUNTER — Telehealth: Payer: Self-pay | Admitting: Orthopaedic Surgery

## 2022-03-20 NOTE — Telephone Encounter (Signed)
Dr. Brooke Bonito patient - pt lvm requesting a refill on Hydrocodone 5-325 to be sent to [facility]

## 2022-04-04 ENCOUNTER — Telehealth: Payer: Self-pay | Admitting: Orthopaedic Surgery

## 2022-04-18 ENCOUNTER — Ambulatory Visit (INDEPENDENT_AMBULATORY_CARE_PROVIDER_SITE_OTHER): Payer: Medicaid Other | Admitting: Orthopaedic Surgery

## 2022-04-18 ENCOUNTER — Encounter: Payer: Self-pay | Admitting: Orthopaedic Surgery

## 2022-04-18 DIAGNOSIS — M25511 Pain in right shoulder: Secondary | ICD-10-CM

## 2022-04-18 DIAGNOSIS — G8929 Other chronic pain: Secondary | ICD-10-CM

## 2022-04-18 DIAGNOSIS — F17210 Nicotine dependence, cigarettes, uncomplicated: Secondary | ICD-10-CM

## 2022-04-18 MED ORDER — HYDROCODONE-ACETAMINOPHEN 5-325 MG PO TABS: 1.0000 | ORAL_TABLET | Freq: Four times a day (QID) | ORAL | 0 refills | Status: DC | PRN

## 2022-04-18 NOTE — Progress Notes (Signed)
PROCEDURE NOTE:  The patient request injection, verbal consent was obtained.  The right shoulder was prepped appropriately after time out was performed.   Sterile technique was observed and injection of 1 cc of DepoMedrol  with several cc's of plain xylocaine. Anesthesia was provided by ethyl chloride and a 20-gauge needle was used to inject the shoulder area. A posterior approach was used.  The injection was tolerated well.  A band aid dressing was applied.  The patient was advised to apply ice later today and tomorrow to the injection sight as needed.  Encounter Diagnoses  Name Primary?   Chronic right shoulder pain Yes   Nicotine dependence, cigarettes, uncomplicated    Return in six weeks.  I have reviewed the [place] Controlled Substance Reporting System web site prior to prescribing narcotic medicine for this patient.  Call if any problem.  Precautions discussed.  Electronically Signed Darreld Mclean, MD 4/17/20248:54 AM

## 2022-05-09 ENCOUNTER — Telehealth: Payer: Self-pay | Admitting: Orthopaedic Surgery

## 2022-05-29 ENCOUNTER — Ambulatory Visit: Payer: Medicaid Other | Admitting: Orthopaedic Surgery

## 2022-06-05 ENCOUNTER — Ambulatory Visit (INDEPENDENT_AMBULATORY_CARE_PROVIDER_SITE_OTHER): Payer: Medicaid Other | Admitting: Orthopaedic Surgery

## 2022-06-05 ENCOUNTER — Encounter: Payer: Self-pay | Admitting: Orthopaedic Surgery

## 2022-06-05 VITALS — BP 162/95 | HR 92

## 2022-06-05 DIAGNOSIS — M25511 Pain in right shoulder: Secondary | ICD-10-CM | POA: Diagnosis not present

## 2022-06-05 DIAGNOSIS — G8929 Other chronic pain: Secondary | ICD-10-CM | POA: Diagnosis not present

## 2022-06-05 DIAGNOSIS — F17210 Nicotine dependence, cigarettes, uncomplicated: Secondary | ICD-10-CM

## 2022-06-05 MED ORDER — METHYLPREDNISOLONE ACETATE 40 MG/ML IJ SUSP
40.0000 mg | Freq: Once | INTRAMUSCULAR | Status: AC
  Administered 2022-06-05: 40 mg via INTRA_ARTICULAR

## 2022-06-05 MED ORDER — HYDROCODONE-ACETAMINOPHEN 5-325 MG PO TABS: 1.0000 | ORAL_TABLET | Freq: Four times a day (QID) | ORAL | 0 refills | Status: DC | PRN

## 2022-06-05 NOTE — Progress Notes (Signed)
PROCEDURE NOTE:  The patient request injection, verbal consent was obtained.  The right shoulder was prepped appropriately after time out was performed.   Sterile technique was observed and injection of 1 cc of DepoMedrol 40mg  with several cc's of plain xylocaine. Anesthesia was provided by ethyl chloride and a 20-gauge needle was used to inject the shoulder area. A posterior approach was used.  The injection was tolerated well.  A band aid dressing was applied.  The patient was advised to apply ice later today and tomorrow to the injection sight as needed.  Encounter Diagnoses  Name Primary?   Chronic right shoulder pain Yes   Nicotine dependence, cigarettes, uncomplicated    Return in six weeks.  I have reviewed the [place] Controlled Substance Reporting System web site prior to prescribing narcotic medicine for this patient.  Call if any problem.  Precautions discussed.  Electronically Signed Darreld Mclean, MD 6/4/20241:24 PM

## 2022-07-17 ENCOUNTER — Ambulatory Visit: Payer: Medicaid Other | Admitting: Orthopaedic Surgery

## 2022-07-24 ENCOUNTER — Ambulatory Visit: Payer: MEDICAID | Admitting: Orthopaedic Surgery

## 2022-07-24 ENCOUNTER — Encounter: Payer: Self-pay | Admitting: Orthopaedic Surgery

## 2022-07-24 DIAGNOSIS — M25511 Pain in right shoulder: Secondary | ICD-10-CM | POA: Diagnosis not present

## 2022-07-24 DIAGNOSIS — G8929 Other chronic pain: Secondary | ICD-10-CM

## 2022-07-24 DIAGNOSIS — F17210 Nicotine dependence, cigarettes, uncomplicated: Secondary | ICD-10-CM

## 2022-07-24 MED ORDER — HYDROCODONE-ACETAMINOPHEN 5-325 MG PO TABS: 1.0000 | ORAL_TABLET | Freq: Four times a day (QID) | ORAL | 0 refills | Status: DC | PRN

## 2022-07-24 MED ORDER — METHYLPREDNISOLONE ACETATE 40 MG/ML IJ SUSP
40.0000 mg | Freq: Once | INTRAMUSCULAR | Status: AC
  Administered 2022-07-24: 40 mg via INTRA_ARTICULAR

## 2022-07-24 NOTE — Progress Notes (Signed)
PROCEDURE NOTE:  The patient request injection, verbal consent was obtained.  The right shoulder was prepped appropriately after time out was performed.   Sterile technique was observed and injection of 1 cc of DepoMedrol 40mg  with several cc's of plain xylocaine. Anesthesia was provided by ethyl chloride and a 20-gauge needle was used to inject the shoulder area. A posterior approach was used.  The injection was tolerated well.  A band aid dressing was applied.  The patient was advised to apply ice later today and tomorrow to the injection sight as needed.  Encounter Diagnoses  Name Primary?   Chronic right shoulder pain Yes   Nicotine dependence, cigarettes, uncomplicated    I have reviewed the [place] Controlled Substance Reporting System web site prior to prescribing narcotic medicine for this patient.  Return in five weeks.  Call if any problem.  Precautions discussed.  Electronically Signed Darreld Mclean, MD 7/23/20241:33 PM

## 2022-08-10 ENCOUNTER — Telehealth: Payer: Self-pay | Admitting: Orthopaedic Surgery

## 2022-08-28 ENCOUNTER — Ambulatory Visit: Payer: MEDICAID | Admitting: Orthopaedic Surgery

## 2022-09-04 ENCOUNTER — Encounter: Payer: Self-pay | Admitting: Orthopaedic Surgery

## 2022-09-04 ENCOUNTER — Ambulatory Visit: Payer: MEDICAID | Admitting: Orthopaedic Surgery

## 2022-09-04 DIAGNOSIS — M25511 Pain in right shoulder: Secondary | ICD-10-CM | POA: Diagnosis not present

## 2022-09-04 DIAGNOSIS — F17210 Nicotine dependence, cigarettes, uncomplicated: Secondary | ICD-10-CM

## 2022-09-04 DIAGNOSIS — G8929 Other chronic pain: Secondary | ICD-10-CM

## 2022-09-04 MED ORDER — HYDROCODONE-ACETAMINOPHEN 5-325 MG PO TABS: 1.0000 | ORAL_TABLET | Freq: Four times a day (QID) | ORAL | 0 refills | Status: DC | PRN

## 2022-09-04 MED ORDER — METHYLPREDNISOLONE ACETATE 40 MG/ML IJ SUSP
40.0000 mg | Freq: Once | INTRAMUSCULAR | Status: AC
  Administered 2022-09-04: 40 mg via INTRA_ARTICULAR

## 2022-09-04 NOTE — Progress Notes (Signed)
PROCEDURE NOTE:  The patient request injection, verbal consent was obtained.  The right shoulder was prepped appropriately after time out was performed.   Sterile technique was observed and injection of 1 cc of DepoMedrol 40mg  with several cc's of plain xylocaine. Anesthesia was provided by ethyl chloride and a 20-gauge needle was used to inject the shoulder area. A posterior approach was used.  The injection was tolerated well.  A band aid dressing was applied.  The patient was advised to apply ice later today and tomorrow to the injection sight as needed.   Encounter Diagnoses  Name Primary?   Chronic right shoulder pain Yes   Nicotine dependence, cigarettes, uncomplicated     Return in one month.  I have reviewed the [place] Controlled Substance Reporting System web site prior to prescribing narcotic medicine for this patient.  Call if any problem.  Precautions discussed.  Electronically Signed Darreld Mclean, MD 9/3/20241:37 PM

## 2022-09-19 ENCOUNTER — Telehealth: Payer: Self-pay | Admitting: Orthopaedic Surgery

## 2022-10-02 ENCOUNTER — Ambulatory Visit: Payer: MEDICAID | Admitting: Orthopaedic Surgery

## 2022-10-09 ENCOUNTER — Ambulatory Visit: Payer: MEDICAID | Admitting: Orthopaedic Surgery

## 2022-10-09 ENCOUNTER — Telehealth: Payer: Self-pay | Admitting: Orthopaedic Surgery

## 2022-10-23 ENCOUNTER — Encounter: Payer: Self-pay | Admitting: Orthopaedic Surgery

## 2022-10-23 ENCOUNTER — Ambulatory Visit (INDEPENDENT_AMBULATORY_CARE_PROVIDER_SITE_OTHER): Payer: MEDICAID | Admitting: Orthopaedic Surgery

## 2022-10-23 DIAGNOSIS — M25511 Pain in right shoulder: Secondary | ICD-10-CM

## 2022-10-23 DIAGNOSIS — G8929 Other chronic pain: Secondary | ICD-10-CM | POA: Diagnosis not present

## 2022-10-23 MED ORDER — HYDROCODONE-ACETAMINOPHEN 5-325 MG PO TABS: 1.0000 | ORAL_TABLET | Freq: Four times a day (QID) | ORAL | 0 refills | Status: DC | PRN

## 2022-10-23 MED ORDER — METHYLPREDNISOLONE ACETATE 40 MG/ML IJ SUSP
40.0000 mg | Freq: Once | INTRAMUSCULAR | Status: AC
  Administered 2022-10-24: 40 mg via INTRA_ARTICULAR

## 2022-10-23 NOTE — Progress Notes (Signed)
PROCEDURE NOTE:  The patient request injection, verbal consent was obtained.  The right shoulder was prepped appropriately after time out was performed.   Sterile technique was observed and injection of 1 cc of DepoMedrol 40mg  with several cc's of plain xylocaine. Anesthesia was provided by ethyl chloride and a 20-gauge needle was used to inject the shoulder area. A posterior approach was used.  The injection was tolerated well.  A band aid dressing was applied.  The patient was advised to apply ice later today and tomorrow to the injection sight as needed.  Encounter Diagnosis  Name Primary?   Chronic right shoulder pain Yes   Return in one month.  I have reviewed the [place] Controlled Substance Reporting System web site prior to prescribing narcotic medicine for this patient.  Call if any problem.  Precautions discussed.  Electronically Signed Darreld Mclean, MD 10/22/20241:42 PM

## 2022-10-24 DIAGNOSIS — M25511 Pain in right shoulder: Secondary | ICD-10-CM | POA: Diagnosis not present

## 2022-10-24 DIAGNOSIS — G8929 Other chronic pain: Secondary | ICD-10-CM | POA: Diagnosis not present

## 2022-11-06 ENCOUNTER — Other Ambulatory Visit: Payer: Self-pay | Admitting: Orthopaedic Surgery

## 2022-11-27 ENCOUNTER — Ambulatory Visit: Payer: MEDICAID | Admitting: Orthopaedic Surgery

## 2022-11-28 ENCOUNTER — Encounter: Payer: Self-pay | Admitting: Orthopaedic Surgery

## 2022-11-28 ENCOUNTER — Ambulatory Visit: Payer: MEDICAID | Admitting: Orthopaedic Surgery

## 2022-11-28 DIAGNOSIS — M25511 Pain in right shoulder: Secondary | ICD-10-CM

## 2022-11-28 DIAGNOSIS — G8929 Other chronic pain: Secondary | ICD-10-CM

## 2022-11-28 MED ORDER — HYDROCODONE-ACETAMINOPHEN 5-325 MG PO TABS: 1.0000 | ORAL_TABLET | Freq: Four times a day (QID) | ORAL | 0 refills | Status: DC | PRN

## 2022-11-28 NOTE — Progress Notes (Signed)
PROCEDURE NOTE:  The patient request injection, verbal consent was obtained.  The right shoulder was prepped appropriately after time out was performed.   Sterile technique was observed and injection of 1 cc of DepoMedrol 40mg  with several cc's of plain xylocaine. Anesthesia was provided by ethyl chloride and a 20-gauge needle was used to inject the shoulder area. A posterior approach was used.  The injection was tolerated well.  A band aid dressing was applied.  The patient was advised to apply ice later today and tomorrow to the injection sight as needed.  Encounter Diagnosis  Name Primary?   Chronic right shoulder pain Yes   Return in six weeks.  I have reviewed the [place] Controlled Substance Reporting System web site prior to prescribing narcotic medicine for this patient.  Call if any problem.  Precautions discussed.  Electronically Signed Darreld Mclean, MD 11/27/20241:54 PM

## 2022-12-03 ENCOUNTER — Other Ambulatory Visit: Payer: Self-pay

## 2022-12-12 ENCOUNTER — Telehealth: Payer: Self-pay | Admitting: Orthopaedic Surgery

## 2023-01-09 ENCOUNTER — Ambulatory Visit: Payer: MEDICAID | Admitting: Orthopaedic Surgery

## 2023-01-23 ENCOUNTER — Ambulatory Visit: Payer: MEDICAID | Admitting: Orthopaedic Surgery

## 2023-01-30 ENCOUNTER — Ambulatory Visit: Payer: MEDICAID | Admitting: Orthopaedic Surgery

## 2023-01-30 ENCOUNTER — Encounter: Payer: Self-pay | Admitting: Orthopaedic Surgery

## 2023-01-30 DIAGNOSIS — G8929 Other chronic pain: Secondary | ICD-10-CM | POA: Diagnosis not present

## 2023-01-30 DIAGNOSIS — M25511 Pain in right shoulder: Secondary | ICD-10-CM | POA: Diagnosis not present

## 2023-01-30 DIAGNOSIS — F17210 Nicotine dependence, cigarettes, uncomplicated: Secondary | ICD-10-CM

## 2023-01-30 MED ORDER — HYDROCODONE-ACETAMINOPHEN 5-325 MG PO TABS: 1.0000 | ORAL_TABLET | Freq: Four times a day (QID) | ORAL | 0 refills | Status: DC | PRN

## 2023-01-30 MED ORDER — METHYLPREDNISOLONE ACETATE 40 MG/ML IJ SUSP
40.0000 mg | Freq: Once | INTRAMUSCULAR | Status: AC
  Administered 2023-01-30: 40 mg via INTRA_ARTICULAR

## 2023-01-30 NOTE — Patient Instructions (Signed)
Steps to Quit Smoking Smoking tobacco is the leading cause of preventable death. It can affect almost every organ in the body. Smoking puts you and people around you at risk for many serious, long-lasting (chronic) diseases. Quitting smoking can be hard, but it is one of the best things that you can do for your health. It is never too late to quit. Do not give up if you cannot quit the first time. Some people need to try many times to quit. Do your best to stick to your quit plan, and talk with your doctor if you have any questions or concerns. How do I get ready to quit? Pick a date to quit. Set a date within the next 2 weeks to give you time to prepare. Write down the reasons why you are quitting. Keep this list in places where you will see it often. Tell your family, friends, and co-workers that you are quitting. Their support is important. Talk with your doctor about the choices that may help you quit. Find out if your health insurance will pay for these treatments. Know the people, places, things, and activities that make you want to smoke (triggers). Avoid them. What first steps can I take to quit smoking? Throw away all cigarettes at home, at work, and in your car. Throw away the things that you use when you smoke, such as ashtrays and lighters. Clean your car. Empty the ashtray. Clean your home, including curtains and carpets. What can I do to help me quit smoking? Talk with your doctor about taking medicines and seeing a counselor. You are more likely to succeed when you do both. If you are pregnant or breastfeeding: Talk with your doctor about counseling or other ways to quit smoking. Do not take medicine to help you quit smoking unless your doctor tells you to. Quit right away Quit smoking completely, instead of slowly cutting back on how much you smoke over a period of time. Stopping smoking right away may be more successful than slowly quitting. Go to counseling. In-person is best  if this is an option. You are more likely to quit if you go to counseling sessions regularly. Take medicine You may take medicines to help you quit. Some medicines need a prescription, and some you can buy over-the-counter. Some medicines may contain a drug called nicotine to replace the nicotine in cigarettes. Medicines may: Help you stop having the desire to smoke (cravings). Help to stop the problems that come when you stop smoking (withdrawal symptoms). Your doctor may ask you to use: Nicotine patches, gum, or lozenges. Nicotine inhalers or sprays. Non-nicotine medicine that you take by mouth. Find resources Find resources and other ways to help you quit smoking and remain smoke-free after you quit. They include: Online chats with a Veterinary surgeon. Phone quitlines. Printed Materials engineer. Support groups or group counseling. Text messaging programs. Mobile phone apps. Use apps on your mobile phone or tablet that can help you stick to your quit plan. Examples of free services include Quit Guide from the CDC and smokefree.gov  What can I do to make it easier to quit?  Talk to your family and friends. Ask them to support and encourage you. Call a phone quitline, such as 1-800-QUIT-NOW, reach out to support groups, or work with a Veterinary surgeon. Ask people who smoke to not smoke around you. Avoid places that make you want to smoke, such as: Bars. Parties. Smoke-break areas at work. Spend time with people who do not smoke. Lower  the stress in your life. Stress can make you want to smoke. Try these things to lower stress: Getting regular exercise. Doing deep-breathing exercises. Doing yoga. Meditating. What benefits will I see if I quit smoking? Over time, you may have: A better sense of smell and taste. Less coughing and sore throat. A slower heart rate. Lower blood pressure. Clearer skin. Better breathing. Fewer sick days. Summary Quitting smoking can be hard, but it is one of  the best things that you can do for your health. Do not give up if you cannot quit the first time. Some people need to try many times to quit. When you decide to quit smoking, make a plan to help you succeed. Quit smoking right away, not slowly over a period of time. When you start quitting, get help and support to keep you smoke-free. This information is not intended to replace advice given to you by your health care provider. Make sure you discuss any questions you have with your health care provider. Document Revised: 12/09/2020 Document Reviewed: 12/09/2020 Elsevier Patient Education  2024 [employer].

## 2023-01-30 NOTE — Addendum Note (Signed)
Addended by: Michaele Offer on: 01/30/2023 01:37 PM   Modules accepted: Orders

## 2023-01-30 NOTE — Progress Notes (Signed)
PROCEDURE NOTE:  The patient request injection, verbal consent was obtained.  The right shoulder was prepped appropriately after time out was performed.   Sterile technique was observed and injection of 1 cc of DepoMedrol 40mg  with several cc's of plain xylocaine. Anesthesia was provided by ethyl chloride and a 20-gauge needle was used to inject the shoulder area. A posterior approach was used.  The injection was tolerated well.  A band aid dressing was applied.  The patient was advised to apply ice later today and tomorrow to the injection sight as needed.  Encounter Diagnoses  Name Primary?   Chronic right shoulder pain Yes   Nicotine dependence, cigarettes, uncomplicated    I have reviewed the [place] Controlled Substance Reporting System web site prior to prescribing narcotic medicine for this patient.  Return in six weeks.  Call if any problem.  Precautions discussed.  Electronically Signed Darreld Mclean, MD 1/29/20251:36 PM

## 2023-02-14 ENCOUNTER — Other Ambulatory Visit: Payer: Self-pay | Admitting: Orthopaedic Surgery

## 2023-02-14 ENCOUNTER — Telehealth: Payer: Self-pay | Admitting: Orthopaedic Surgery

## 2023-02-14 NOTE — Telephone Encounter (Signed)
Dr. Sanjuan Dame pt - spoke w/the pt, she is requesting a refill on Hydrocodone 5-325 to be sent to [facility].

## 2023-03-06 ENCOUNTER — Other Ambulatory Visit: Payer: Self-pay | Admitting: Orthopaedic Surgery

## 2023-03-07 ENCOUNTER — Telehealth: Payer: Self-pay | Admitting: Orthopaedic Surgery

## 2023-03-07 NOTE — Telephone Encounter (Signed)
 DR. Hilda Lias   Dr. Hilda Lias patient called about her pain medicine she got [facility] to request refill yesterday and it is under [facility]?  HYDROcodone-acetaminophen (NORCO/VICODIN) 5-325 MG tablet   Pharmacy: [facility]

## 2023-03-08 MED ORDER — HYDROCODONE-ACETAMINOPHEN 5-325 MG PO TABS: 1.0000 | ORAL_TABLET | Freq: Four times a day (QID) | ORAL | 0 refills | Status: DC | PRN

## 2023-03-13 ENCOUNTER — Encounter: Payer: Self-pay | Admitting: Orthopaedic Surgery

## 2023-03-13 ENCOUNTER — Ambulatory Visit (INDEPENDENT_AMBULATORY_CARE_PROVIDER_SITE_OTHER): Payer: MEDICAID | Admitting: Orthopaedic Surgery

## 2023-03-13 DIAGNOSIS — M25511 Pain in right shoulder: Secondary | ICD-10-CM | POA: Diagnosis not present

## 2023-03-13 DIAGNOSIS — G8929 Other chronic pain: Secondary | ICD-10-CM | POA: Diagnosis not present

## 2023-03-13 DIAGNOSIS — F17210 Nicotine dependence, cigarettes, uncomplicated: Secondary | ICD-10-CM

## 2023-03-13 NOTE — Progress Notes (Signed)
 PROCEDURE NOTE:  The patient request injection, verbal consent was obtained.  The right shoulder was prepped appropriately after time out was performed.   Sterile technique was observed and injection of 1 cc of DepoMedrol 40mg  with several cc's of plain xylocaine. Anesthesia was provided by ethyl chloride and a 20-gauge needle was used to inject the shoulder area. A posterior approach was used.  The injection was tolerated well.  A band aid dressing was applied.  The patient was advised to apply ice later today and tomorrow to the injection sight as needed.  Encounter Diagnoses  Name Primary?   Chronic right shoulder pain Yes   Nicotine dependence, cigarettes, uncomplicated    Return in six weeks.  Call if any problem.  Precautions discussed.  Electronically Signed Darreld Mclean, MD 3/12/20251:39 PM

## 2023-03-21 ENCOUNTER — Telehealth: Payer: Self-pay | Admitting: Orthopaedic Surgery

## 2023-04-11 ENCOUNTER — Other Ambulatory Visit: Payer: Self-pay | Admitting: Orthopaedic Surgery

## 2023-04-11 ENCOUNTER — Telehealth: Payer: Self-pay | Admitting: Orthopaedic Surgery

## 2023-04-11 NOTE — Telephone Encounter (Signed)
 Dr. Sanjuan Dame pt - spoke w/the pt, she is requesting a refill for Hydrocodone 5-325 to be sent to [facility].

## 2023-04-24 ENCOUNTER — Ambulatory Visit: Payer: MEDICAID | Admitting: Orthopaedic Surgery

## 2023-05-01 ENCOUNTER — Ambulatory Visit: Payer: MEDICAID | Admitting: Orthopaedic Surgery

## 2023-05-08 ENCOUNTER — Telehealth: Payer: Self-pay | Admitting: Orthopaedic Surgery

## 2023-05-15 ENCOUNTER — Ambulatory Visit: Payer: MEDICAID | Admitting: Orthopaedic Surgery

## 2023-05-15 ENCOUNTER — Encounter: Payer: Self-pay | Admitting: Orthopaedic Surgery

## 2023-05-15 DIAGNOSIS — G8929 Other chronic pain: Secondary | ICD-10-CM

## 2023-05-15 DIAGNOSIS — M25511 Pain in right shoulder: Secondary | ICD-10-CM

## 2023-05-15 DIAGNOSIS — F17210 Nicotine dependence, cigarettes, uncomplicated: Secondary | ICD-10-CM

## 2023-05-15 NOTE — Progress Notes (Signed)
 PROCEDURE NOTE:  The patient request injection, verbal consent was obtained.  The right shoulder was prepped appropriately after time out was performed.   Sterile technique was observed and injection of 1 cc of DepoMedrol 40mg  with several cc's of plain xylocaine. Anesthesia was provided by ethyl chloride and a 20-gauge needle was used to inject the shoulder area. A posterior approach was used.  The injection was tolerated well.  A band aid dressing was applied.  The patient was advised to apply ice later today and tomorrow to the injection sight as needed.  Encounter Diagnoses  Name Primary?   Chronic right shoulder pain Yes   Nicotine dependence, cigarettes, uncomplicated    Return in six weeks.  Call if any problem.  Precautions discussed.  Electronically Signed Pleasant Brilliant, MD 5/14/20252:15 PM

## 2023-06-19 ENCOUNTER — Ambulatory Visit: Payer: MEDICAID | Admitting: Orthopaedic Surgery

## 2023-06-19 ENCOUNTER — Encounter: Payer: Self-pay | Admitting: Orthopaedic Surgery

## 2023-06-19 DIAGNOSIS — G8929 Other chronic pain: Secondary | ICD-10-CM | POA: Diagnosis not present

## 2023-06-19 DIAGNOSIS — M25511 Pain in right shoulder: Secondary | ICD-10-CM | POA: Diagnosis not present

## 2023-06-19 MED ORDER — HYDROCODONE-ACETAMINOPHEN 5-325 MG PO TABS: 1.0000 | ORAL_TABLET | Freq: Four times a day (QID) | ORAL | 0 refills | Status: DC | PRN

## 2023-06-19 MED ORDER — METHYLPREDNISOLONE ACETATE 40 MG/ML IJ SUSP
40.0000 mg | Freq: Once | INTRAMUSCULAR | Status: AC
  Administered 2023-06-19: 40 mg via INTRA_ARTICULAR

## 2023-06-19 NOTE — Addendum Note (Signed)
 Addended by: [place] Snow T on: 06/19/2023 03:30 PM   Modules accepted: Orders

## 2023-06-19 NOTE — Progress Notes (Signed)
 PROCEDURE NOTE:  The patient request injection, verbal consent was obtained.  The right shoulder was prepped appropriately after time out was performed.   Sterile technique was observed and injection of 1 cc of DepoMedrol 40mg  with several cc's of plain xylocaine. Anesthesia was provided by ethyl chloride and a 20-gauge needle was used to inject the shoulder area. A posterior approach was used.  The injection was tolerated well.  A band aid dressing was applied.  The patient was advised to apply ice later today and tomorrow to the injection sight as needed.  Encounter Diagnosis  Name Primary?   Chronic right shoulder pain Yes   I have reviewed the [place]  Controlled Substance Reporting System web site prior to prescribing narcotic medicine for this patient.  Return in six weeks.  Call if any problem.  Precautions discussed.  Electronically Signed Pleasant Brilliant, MD 6/18/20251:58 PM

## 2023-06-27 ENCOUNTER — Ambulatory Visit: Payer: MEDICAID | Admitting: Orthopaedic Surgery

## 2023-07-17 ENCOUNTER — Telehealth: Payer: Self-pay | Admitting: Orthopaedic Surgery

## 2023-07-31 ENCOUNTER — Encounter: Payer: Self-pay | Admitting: Orthopaedic Surgery

## 2023-07-31 ENCOUNTER — Ambulatory Visit (INDEPENDENT_AMBULATORY_CARE_PROVIDER_SITE_OTHER): Payer: MEDICAID | Admitting: Orthopaedic Surgery

## 2023-07-31 DIAGNOSIS — G8929 Other chronic pain: Secondary | ICD-10-CM | POA: Diagnosis not present

## 2023-07-31 DIAGNOSIS — M25511 Pain in right shoulder: Secondary | ICD-10-CM

## 2023-07-31 MED ORDER — HYDROCODONE-ACETAMINOPHEN 5-325 MG PO TABS: 1.0000 | ORAL_TABLET | Freq: Four times a day (QID) | ORAL | 0 refills | Status: DC | PRN

## 2023-07-31 MED ORDER — METHYLPREDNISOLONE ACETATE 40 MG/ML IJ SUSP
40.0000 mg | Freq: Once | INTRAMUSCULAR | Status: AC
  Administered 2023-07-31: 40 mg via INTRA_ARTICULAR

## 2023-07-31 NOTE — Addendum Note (Signed)
 Addended by: MARCINE HUSBAND T on: 07/31/2023 02:18 PM   Modules accepted: Orders

## 2023-07-31 NOTE — Progress Notes (Signed)
 PROCEDURE NOTE:  The patient request injection, verbal consent was obtained.  The right shoulder was prepped appropriately after time out was performed.   Sterile technique was observed and injection of 1 cc of DepoMedrol 40mg  with several cc's of plain xylocaine. Anesthesia was provided by ethyl chloride and a 20-gauge needle was used to inject the shoulder area. A posterior approach was used.  The injection was tolerated well.  A band aid dressing was applied.  The patient was advised to apply ice later today and tomorrow to the injection sight as needed.  Encounter Diagnosis  Name Primary?   Chronic right shoulder pain Yes   I have reviewed the [place]  Controlled Substance Reporting System web site prior to prescribing narcotic medicine for this patient.  Return in six weeks.  Call if any problem.  Precautions discussed.  Electronically Signed Lemond Stable, MD 7/30/20251:41 PM

## 2023-08-22 ENCOUNTER — Telehealth: Payer: Self-pay | Admitting: Orthopaedic Surgery

## 2023-09-11 ENCOUNTER — Ambulatory Visit: Payer: MEDICAID | Admitting: Orthopaedic Surgery

## 2023-09-26 ENCOUNTER — Ambulatory Visit: Payer: MEDICAID | Admitting: Orthopaedic Surgery

## 2023-10-03 ENCOUNTER — Ambulatory Visit: Payer: MEDICAID | Admitting: Orthopaedic Surgery

## 2023-10-03 ENCOUNTER — Encounter: Payer: Self-pay | Admitting: Orthopaedic Surgery

## 2023-10-03 DIAGNOSIS — M25511 Pain in right shoulder: Secondary | ICD-10-CM | POA: Diagnosis not present

## 2023-10-03 DIAGNOSIS — G8929 Other chronic pain: Secondary | ICD-10-CM | POA: Diagnosis not present

## 2023-10-03 MED ORDER — HYDROCODONE-ACETAMINOPHEN 5-325 MG PO TABS: 1.0000 | ORAL_TABLET | Freq: Four times a day (QID) | ORAL | 0 refills | Status: AC | PRN

## 2023-10-03 MED ORDER — METHYLPREDNISOLONE ACETATE 40 MG/ML IJ SUSP
40.0000 mg | Freq: Once | INTRAMUSCULAR | Status: AC
Start: 2023-10-03 — End: 2023-10-03
  Administered 2023-10-03: 40 mg via INTRA_ARTICULAR

## 2023-10-03 NOTE — Progress Notes (Signed)
 PROCEDURE NOTE:  The patient request injection, verbal consent was obtained.  The right shoulder was prepped appropriately after time out was performed.   Sterile technique was observed and injection of 1 cc of DepoMedrol 40mg  with several cc's of plain xylocaine. Anesthesia was provided by ethyl chloride and a 20-gauge needle was used to inject the shoulder area. A posterior approach was used.  The injection was tolerated well.  A band aid dressing was applied.  The patient was advised to apply ice later today and tomorrow to the injection sight as needed.  Encounter Diagnosis  Name Primary?   Chronic right shoulder pain Yes   I have reviewed the [place]  Controlled Substance Reporting System web site prior to prescribing narcotic medicine for this patient.  Return in six weeks.  I have informed the patient I will be retiring from medical practice and from this office on October 03, 2023, today.  The patient has been offered continuing care with Dr. Margrette or Dr. Onesimo of this office.  The patient may choose another provider and the records will be forwarded after proper signature and notification.  Patient understands and agrees.  Call if any problem.  Precautions discussed.  Electronically Signed Lemond Stable, MD 10/2/20251:06 PM

## 2023-10-03 NOTE — Addendum Note (Signed)
 Addended by: VICENTA EMMIE HERO on: 10/03/2023 01:14 PM   Modules accepted: Orders

## 2023-10-23 ENCOUNTER — Other Ambulatory Visit (INDEPENDENT_AMBULATORY_CARE_PROVIDER_SITE_OTHER): Payer: MEDICAID

## 2023-10-23 ENCOUNTER — Ambulatory Visit: Payer: MEDICAID | Admitting: Orthopedic Surgery

## 2023-10-23 VITALS — BP 149/97 | HR 102 | Ht 67.0 in | Wt 243.0 lb

## 2023-10-23 DIAGNOSIS — G8929 Other chronic pain: Secondary | ICD-10-CM

## 2023-10-23 DIAGNOSIS — M25511 Pain in right shoulder: Secondary | ICD-10-CM

## 2023-10-23 NOTE — Patient Instructions (Signed)
 When the injection starts to wear off, please contact the clinic and we can try another type of injection using an ultrasound  We can consider physical therapy  We can place a referral to pain management if you need to continue with narcotic medications

## 2023-10-25 ENCOUNTER — Encounter: Payer: Self-pay | Admitting: Orthopedic Surgery

## 2023-10-25 NOTE — Progress Notes (Signed)
 New Patient Visit  Assessment: Betty Lutz is a 58 y.o. female with the following: 1. Chronic right shoulder pain  Plan: Betty Lutz has chronic pain in the right shoulder.  No specific injury.  Recent injection has been helpful.  If injection wears off we can consider an ultrasound guided injection.  PT is also an option.  Consider a pain management referral if narcotics needed moving forward.   Follow-up: Return if symptoms worsen or fail to improve.  Subjective:  Chief Complaint  Patient presents with   Shoulder Pain    R for yrs, previously seeing Dr. Brenna     History of Present Illness: Betty Lutz is a 57 y.o. female who presents for evaluation of right shoulder pain.  She has been seeing Dr. Brenna for years.  No specific injury.  Pain in the anterior shoulder.  Slightly restricted range of motion. No numbness or tingling.  Recent injection has been helpful.     Review of Systems: No fevers or chills No numbness or tingling No chest pain No shortness of breath No bowel or bladder dysfunction No GI distress No headaches   Medical History:  Past Medical History:  Diagnosis Date   Depression    Diabetes mellitus without complication (HCC)    Gout    High cholesterol    Hypertension     Past Surgical History:  Procedure Laterality Date   ABDOMINAL HYSTERECTOMY     CHOLECYSTECTOMY     TONSILLECTOMY      Family History  Problem Relation Age of Onset   Cancer Father        prostate   Social History   Tobacco Use   Smoking status: Every Day    Current packs/day: 0.25    Types: Cigarettes   Smokeless tobacco: Never  Substance Use Topics   Alcohol use: No    Comment: perviously used 1 can of beer daily   Drug use: No    No Known Allergies  No outpatient medications have been marked as taking for the 10/23/23 encounter (Office Visit) with Onesimo Oneil DELENA, MD.    Objective: BP (!) 149/97   Pulse (!) 102   Ht 5' 7 (1.702 m)   Wt  243 lb (110.2 kg)   BMI 38.06 kg/m   Physical Exam:  General: Alert and oriented. and No acute distress. Gait: Normal gait.  RIght shoulder without deformity.  No redness.  Restricted motion.  Sensation intact in the axillary nerve distribution.  Fingers are warm and well perfused.     IMAGING: I personally ordered and reviewed the following images  XR of the right shoulder was obtained in clinic today.  No acute injury.  Mild moss of glenohumeral joint space.  No osteophytes.  No proximal humerus migration.  No bony lesion.   Impression:  Negative right shoulder XR   New Medications:  No orders of the defined types were placed in this encounter.     Oneil DELENA Onesimo, MD  10/25/2023 11:36 PM

## 2023-11-13 ENCOUNTER — Ambulatory Visit: Payer: MEDICAID | Admitting: Orthopedic Surgery
